# Patient Record
Sex: Male | Born: 1970 | Race: White | Hispanic: No | Marital: Single | Smoking: Never smoker
Health system: Southern US, Community
[De-identification: ages and names within clinical notes are randomized; demographics above are authoritative.]

## PROBLEM LIST (undated history)

## (undated) DIAGNOSIS — E1065 Type 1 diabetes mellitus with hyperglycemia: Secondary | ICD-10-CM

## (undated) DIAGNOSIS — F79 Unspecified intellectual disabilities: Secondary | ICD-10-CM

## (undated) DIAGNOSIS — Q079 Congenital malformation of nervous system, unspecified: Secondary | ICD-10-CM

## (undated) DIAGNOSIS — E785 Hyperlipidemia, unspecified: Secondary | ICD-10-CM

## (undated) DIAGNOSIS — IMO0002 Reserved for concepts with insufficient information to code with codable children: Secondary | ICD-10-CM

## (undated) DIAGNOSIS — S069XAA Unspecified intracranial injury with loss of consciousness status unknown, initial encounter: Secondary | ICD-10-CM

## (undated) DIAGNOSIS — S069X9A Unspecified intracranial injury with loss of consciousness of unspecified duration, initial encounter: Secondary | ICD-10-CM

## (undated) DIAGNOSIS — I10 Essential (primary) hypertension: Secondary | ICD-10-CM

## (undated) HISTORY — DX: Type 1 diabetes mellitus with hyperglycemia: E10.65

## (undated) HISTORY — DX: Reserved for concepts with insufficient information to code with codable children: IMO0002

## (undated) HISTORY — DX: Congenital malformation of nervous system, unspecified: Q07.9

---

## 2013-02-04 ENCOUNTER — Encounter (HOSPITAL_COMMUNITY): Payer: Self-pay | Admitting: Emergency Medicine

## 2013-02-04 ENCOUNTER — Emergency Department (HOSPITAL_COMMUNITY)
Admission: EM | Admit: 2013-02-04 | Discharge: 2013-02-05 | Disposition: A | Discharge status: Home / Self Care | Payer: Medicare Other | Attending: Emergency Medicine | Admitting: Emergency Medicine

## 2013-02-04 DIAGNOSIS — F79 Unspecified intellectual disabilities: Secondary | ICD-10-CM | POA: Insufficient documentation

## 2013-02-04 DIAGNOSIS — E162 Hypoglycemia, unspecified: Secondary | ICD-10-CM

## 2013-02-04 DIAGNOSIS — Z79899 Other long term (current) drug therapy: Secondary | ICD-10-CM | POA: Insufficient documentation

## 2013-02-04 DIAGNOSIS — Z87820 Personal history of traumatic brain injury: Secondary | ICD-10-CM | POA: Insufficient documentation

## 2013-02-04 DIAGNOSIS — E1069 Type 1 diabetes mellitus with other specified complication: Secondary | ICD-10-CM | POA: Insufficient documentation

## 2013-02-04 DIAGNOSIS — Z794 Long term (current) use of insulin: Secondary | ICD-10-CM | POA: Insufficient documentation

## 2013-02-04 HISTORY — DX: Unspecified intellectual disabilities: F79

## 2013-02-04 HISTORY — DX: Unspecified intracranial injury with loss of consciousness status unknown, initial encounter: S06.9XAA

## 2013-02-04 HISTORY — DX: Unspecified intracranial injury with loss of consciousness of unspecified duration, initial encounter: S06.9X9A

## 2013-02-04 LAB — GLUCOSE, CAPILLARY: GLUCOSE-CAPILLARY: 105 mg/dL — AB (ref 70–99)

## 2013-02-04 MED ORDER — SODIUM CHLORIDE 0.9 % IV BOLUS (SEPSIS)
1000.0000 mL | Freq: Once | INTRAVENOUS | Status: AC
  Administered 2013-02-05: 1000 mL via INTRAVENOUS

## 2013-02-04 NOTE — ED Notes (Signed)
2 attempts for IV stick unsuccessful. CN to attempt draw.

## 2013-02-04 NOTE — ED Notes (Signed)
CBG registered 86 on ED Glucometer

## 2013-02-04 NOTE — ED Provider Notes (Signed)
CSN: [id number]     Arrival date & time 02/04/13  2051 History   First MD Initiated Contact with Patient 02/04/13 2216     Chief Complaint  Patient presents with  . Hypoglycemia   (Consider location/radiation/quality/duration/timing/severity/associated sxs/prior Treatment) HPI  43 year old male with history of mental retardation, hx of traumatic brain injury presents for revaluation of hypoglycemia. History obtained through caregiver who is bedside. Patient with history of type 1 diabetes was found to be hypoglycemic today on regular blood sugar checked at 7:45 PM. His initial CBG was 56.  Patient has no specific symptom at that time. Caregiver indicate that patient is new to facility and has been in that facility for the past 5 days. Patient was recently removed because his mother who is the caregiver is recently diagnosed with cancer and unable to care for and appropriately. Per care giver; patient's blood sugar usually ranging between 86-225 however today CBG is 56.  It was a regular CBG check 3 hrs after he has dinner.  Pt received no treatment, has no specific complaint, was sent here for further care.  No reported recent medication change.  No recent sickness.  When asked if pt has any specific sxs or having any pain, pt denies.  Otherwise difficult to get hx from pt due to baseline mental retardation.     Past Medical History  Diagnosis Date  . 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 Mental retardation    History reviewed. No pertinent past surgical history. History reviewed. No pertinent family history. History  Substance Use Topics  . Smoking status: Never Smoker   . Smokeless tobacco: Never Used  . Alcohol Use: No    Review of Systems  Unable to perform ROS Hx of mental retardation  Allergies  Percocet and Tape  Home Medications   Current Outpatient Rx  Name  Route  Sig  Dispense  Refill  . amLODipine (NORVASC) 10 MG tablet   Oral   Take 10 mg  by mouth daily.         . benztropine (COGENTIN) 1 MG tablet   Oral   Take 1 mg by mouth 2 (two) times daily.         . cloNIDine (CATAPRES) 0.1 MG tablet   Oral   Take 0.1 mg by mouth 2 (two) times daily.         . DULoxetine (CYMBALTA) 30 MG capsule   Oral   Take 30 mg by mouth 2 (two) times daily.         . fexofenadine (ALLEGRA) 180 MG tablet   Oral   Take 180 mg by mouth daily.         . furosemide (LASIX) 40 MG tablet   Oral   Take 40 mg by mouth 2 (two) times daily.         . insulin aspart (NOVOLOG) 100 UNIT/ML injection   Subcutaneous   Inject 5-14 Units into the skin 3 (three) times daily before meals. Sliding scale         . insulin glargine (LANTUS) 100 UNIT/ML injection   Subcutaneous   Inject 60 Units into the skin at bedtime.         . lamoTRIgine (LAMICTAL) 100 MG tablet   Oral   Take 100 mg by mouth 2 (two) times daily.         Marland Kitchen. lisinopril-hydrochlorothiazide (PRINZIDE,ZESTORETIC) 20-12.5 MG per tablet   Oral   Take 1 tablet by mouth daily.         .Marland Kitchen  LORazepam (ATIVAN) 2 MG tablet   Oral   Take 2 mg by mouth every 4 (four) hours as needed for anxiety.         Marland Kitchen omeprazole (PRILOSEC) 40 MG capsule   Oral   Take 40 mg by mouth daily.         . traMADol (ULTRAM) 50 MG tablet   Oral   Take 50 mg by mouth every 6 (six) hours as needed.         . ziprasidone (GEODON) 80 MG capsule   Oral   Take 80 mg by mouth 2 (two) times daily with a meal.          BP 148/90  Pulse 108  Temp(Src) 98.7 F (37.1 C) (Oral)  Resp 18  SpO2 100% Physical Exam  Constitutional: He appears well-developed and well-nourished. No distress.  Baseline mental retardation, nontoxic in appearance.  HENT:  Head: Atraumatic.  Mouth/Throat: Oropharynx is clear and moist.  Eyes: Conjunctivae are normal.  Neck: Normal range of motion. Neck supple.  Cardiovascular: Normal rate.   Pulmonary/Chest: Effort normal and breath sounds normal.   Abdominal: Soft. There is no tenderness.  Musculoskeletal: He exhibits no edema.  Neurological: He is alert.  Skin: No rash noted.  Psychiatric: He has a normal mood and affect.    ED Course  Procedures (including critical care time)  11:11 PM Patient with one reading of hypoglycemia with a CBG of 56 at the nursing facility. CBG is 86 prior to arrival without any specific treatment. Patient has no specific complaint at this time. Will obtain basic labs but I suspect patient will be stable for discharge.  12:40 AM Electrolytes reassuring. Patient has been not elevated white count of 12.8 without left shift I do not think pt has infection.  Stable for discharge at this time.    Labs Review Labs Reviewed  BASIC METABOLIC PANEL - Abnormal; Notable for the following:    Sodium 135 (*)    Potassium 3.4 (*)    Chloride 94 (*)    All other components within normal limits  CBC WITH DIFFERENTIAL - Abnormal; Notable for the following:    WBC 12.8 (*)    Platelets 436 (*)    Neutro Abs 9.2 (*)    Monocytes Absolute 1.2 (*)    All other components within normal limits  GLUCOSE, CAPILLARY - Abnormal; Notable for the following:    Glucose-Capillary 105 (*)    All other components within normal limits   Imaging Review No results found.  EKG Interpretation   None       MDM   1. Hypoglycemia    BP 131/71  Pulse 85  Temp(Src) 97.5 F (36.4 C) (Oral)  Resp 20  SpO2 97%  I have reviewed nursing notes and vital signs.  I reviewed available ER/hospitalization records thought the EMR     Fayrene Helper, [place] 02/05/13 0110

## 2013-02-04 NOTE — ED Notes (Signed)
[facility] c/o hypoglycemia of 50's. Pt has history of behavioral disorder. Pt also c/o of right sided abdominal pain.

## 2013-02-05 LAB — BASIC METABOLIC PANEL
BUN: 22 mg/dL (ref 6–23)
CALCIUM: 9.7 mg/dL (ref 8.4–10.5)
CO2: 24 mEq/L (ref 19–32)
Chloride: 94 mEq/L — ABNORMAL LOW (ref 96–112)
Creatinine, Ser: 0.95 mg/dL (ref 0.50–1.35)
GFR calc Af Amer: >90 mL/min (ref >90)
GFR calc non Af Amer: >90 mL/min (ref >90)
GLUCOSE: 98 mg/dL (ref 70–99)
Potassium: 3.4 mEq/L — ABNORMAL LOW (ref 3.7–5.3)
SODIUM: 135 meq/L — AB (ref 137–147)

## 2013-02-05 LAB — CBC WITH DIFFERENTIAL/PLATELET
BASOS ABS: 0 10*3/uL (ref 0.0–0.1)
BASOS PCT: 0 % (ref 0–1)
EOS ABS: 0.1 10*3/uL (ref 0.0–0.7)
Eosinophils Relative: 1 % (ref 0–5)
HCT: 40.5 % (ref 39.0–52.0)
Hemoglobin: 14.2 g/dL (ref 13.0–17.0)
Lymphocytes Relative: 18 % (ref 12–46)
Lymphs Abs: 2.3 10*3/uL (ref 0.7–4.0)
MCH: 30.9 pg (ref 26.0–34.0)
MCHC: 35.1 g/dL (ref 30.0–36.0)
MCV: 88 fL (ref 78.0–100.0)
Monocytes Absolute: 1.2 10*3/uL — ABNORMAL HIGH (ref 0.1–1.0)
Monocytes Relative: 9 % (ref 3–12)
NEUTROS PCT: 72 % (ref 43–77)
Neutro Abs: 9.2 10*3/uL — ABNORMAL HIGH (ref 1.7–7.7)
PLATELETS: 436 10*3/uL — AB (ref 150–400)
RBC: 4.6 MIL/uL (ref 4.22–5.81)
RDW: 12.6 % (ref 11.5–15.5)
WBC: 12.8 10*3/uL — ABNORMAL HIGH (ref 4.0–10.5)

## 2013-02-05 NOTE — Discharge Instructions (Signed)
Low Blood Sugar °Low blood sugar (hypoglycemia) means that the level of sugar in your blood is lower than it should be. Signs of low blood sugar include: °· Getting sweaty. °· Feeling hungry. °· Feeling dizzy or weak. °· Feeling sleepier than normal. °· Feeling nervous. °· Headaches. °· Having a fast heartbeat. °Low blood sugar can happen fast and can be an emergency. Your doctor can do tests to check your blood sugar level. You can have low blood sugar and not have diabetes. °HOME CARE °· Check your blood sugar as told by your doctor. If it is less than 70 mg/dl or as told by your doctor, take 1 of the following: °· 3 to 4 glucose tablets. °· ½ cup clear juice. °· ½ cup soda pop, not diet. °· 1 cup milk. °· 5 to 6 hard candies. °· Recheck blood sugar after 15 minutes. Repeat until it is at the right level. °· Eat a snack if it is more than 1 hour until the next meal. °· Only take medicine as told by your doctor. °· Do not skip meals. Eat on time. °· Do not drink alcohol except with meals. °· Check your blood glucose before driving. °· Check your blood glucose before and after exercise. °· Always carry treatment with you, such as glucose pills. °· Always wear a medical alert bracelet if you have diabetes. °GET HELP RIGHT AWAY IF:  °· Your blood glucose goes below 70 mg/dl or as told by your doctor, and you: °· Are confused. °· Are not able to swallow. °· Pass out (faint). °· You cannot treat yourself. You may need someone to help you. °· You have low blood sugar problems often. °· You have problems from your medicines. °· You are not feeling better after 3 to 4 days. °· You have vision changes. °MAKE SURE YOU:  °· Understand these instructions. °· Will watch this condition. °· Will get help right away if you are not doing well or get worse. °Document Released: 04/12/2009 Document Revised: 04/10/2011 Document Reviewed: 04/12/2009 °[employer]® Patient Information ©2014 [employer]. ° °

## 2013-02-05 NOTE — ED Provider Notes (Signed)
Medical screening examination/treatment/procedure(s) were performed by non-physician practitioner and as supervising physician I was immediately available for consultation/collaboration.  EKG Interpretation   None         Dagmar HaitWilliam Geronimo Diliberto, MD 02/05/13 0111

## 2013-03-04 ENCOUNTER — Emergency Department (HOSPITAL_COMMUNITY): Payer: Medicare Other

## 2013-03-04 ENCOUNTER — Emergency Department (HOSPITAL_COMMUNITY)
Admission: EM | Admit: 2013-03-04 | Discharge: 2013-03-04 | Disposition: A | Discharge status: Home / Self Care | Payer: Medicare Other | Attending: Emergency Medicine | Admitting: Emergency Medicine

## 2013-03-04 ENCOUNTER — Encounter (HOSPITAL_COMMUNITY): Payer: Self-pay | Admitting: Emergency Medicine

## 2013-03-04 DIAGNOSIS — IMO0002 Reserved for concepts with insufficient information to code with codable children: Secondary | ICD-10-CM | POA: Insufficient documentation

## 2013-03-04 DIAGNOSIS — S0990XA Unspecified injury of head, initial encounter: Secondary | ICD-10-CM

## 2013-03-04 DIAGNOSIS — Z8659 Personal history of other mental and behavioral disorders: Secondary | ICD-10-CM | POA: Insufficient documentation

## 2013-03-04 DIAGNOSIS — Z79899 Other long term (current) drug therapy: Secondary | ICD-10-CM | POA: Insufficient documentation

## 2013-03-04 DIAGNOSIS — S62308A Unspecified fracture of other metacarpal bone, initial encounter for closed fracture: Secondary | ICD-10-CM

## 2013-03-04 DIAGNOSIS — Z794 Long term (current) use of insulin: Secondary | ICD-10-CM | POA: Insufficient documentation

## 2013-03-04 DIAGNOSIS — Y9289 Other specified places as the place of occurrence of the external cause: Secondary | ICD-10-CM | POA: Insufficient documentation

## 2013-03-04 DIAGNOSIS — W2209XA Striking against other stationary object, initial encounter: Secondary | ICD-10-CM | POA: Insufficient documentation

## 2013-03-04 DIAGNOSIS — Z87820 Personal history of traumatic brain injury: Secondary | ICD-10-CM | POA: Insufficient documentation

## 2013-03-04 DIAGNOSIS — S62309A Unspecified fracture of unspecified metacarpal bone, initial encounter for closed fracture: Secondary | ICD-10-CM | POA: Insufficient documentation

## 2013-03-04 DIAGNOSIS — Y9389 Activity, other specified: Secondary | ICD-10-CM | POA: Insufficient documentation

## 2013-03-04 DIAGNOSIS — S0180XA Unspecified open wound of other part of head, initial encounter: Secondary | ICD-10-CM | POA: Insufficient documentation

## 2013-03-04 MED ORDER — TETANUS-DIPHTH-ACELL PERTUSSIS 5-2.5-18.5 LF-MCG/0.5 IM SUSP: 0.5000 mL | Freq: Once | INTRAMUSCULAR | Status: DC
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
## 2013-03-04 NOTE — Discharge Instructions (Signed)
Head injury ° °You have had a head injury which does not appear to require admission at this time. A concussion is a status changed mental ability because of trauma. ° °Seek immediate medical attention if: ° °· There is confusion or drowsiness °· You cannot awaken the injured portion °· (Although children frequently become drowsy after injury) °· There is nausea or continued, forceful vomiting °· You notice dizziness or unsteadiness which is getting worse, or inability to walk °· You have convulsions or unconsciousness °· You experience a severe, persistent headaches not relieved by Tylenol. (Do not take aspirin as this in pairs clotting abilities). Take other pain medications only as directed °· You cannot use arms or legs normally °· There are changes in pupil size of the eye °· There is clear or bloody discharge from the nose or ears °· Change in speech, vision, swallowing or understanding. °· Localized weakness, numbness, tingling or change in bowel or bladder control ° ° °Please followup with your doctor in the next 2 days if still having symptoms. If you do not have a family doctor, see the list of followup contact information below. ° °RESOURCE GUIDE ° °Dental Problems ° °Patients with Medicaid: °[facility]                     [facility] °[address]                                           [address] °Phone:  [phone]                                                  Phone:  [phone] ° °If unable to pay or uninsured, contact:  [facility] or [facility] to become qualified for the adult dental clinic. ° °Chronic Pain Problems °Contact [facility]  297-2271 °Patients need to be referred by their primary care doctor. ° °Insufficient Money for Medicine °Contact [employer]:  call "211" or [facility] 271-5999. ° °No Primary Care Doctor °Call [facility]  832-8000 °Other agencies that provide inexpensive medical care ° [facility]  832-8035 °   [facility]  832-7272 °   [facility]  271-5999 °   [facility]  832-4777 °   [facility]  373-0678 °   [facility]  272-1050 ° °Psychological Services °[facility]  832-9600 °[facility]  378-7881 °[facility]   800 853-5163 (emergency services 641-4993) ° °Substance Abuse Resources °[facility]  336-882-2125 °[facility] 336-784-9470 °[facility] 336-285-9073 °[facility] 336-845-3988 °Residential [facility] Outpatient [facility]stance Abuse Program  800-659-3381 ° °Abuse/Neglect °[place] [facility] (336) 641-3795 °[place] [facility] 800-378-5315 (After Hours) ° °Emergency Shelter °[facility] (336) 271-5985 ° °Maternity Homes °[facility] (336) 275-9566 °[facility] (704) 372-4663 ° °MRSA Hotline #:   832-7006 ° ° ° °[place] [facility] ° °[facility] [place] [facility]     [employer]                            [facility] [address]                        [address]      [address] [place] [address]  Blondell Reveal Phone:  [phone]                                   Phone:  (531)[phone]                 Phone:  878-042-7923  [facility] Phone:  (614)035-9956  [facility] Child Abuse Hotline 684-741-0449 2760489639 (After Hours)     Metacarpal Fracture   The metacarpal bones are in the middle of the hand, connecting the fingers to the wrist. A metacarpal fracture is a break in one of these bones. It is common for an injury of the hand to break one or more of these bones. A metacarpal fracture of the fifth (little) finger, near the knuckle, is also known as a boxer's fracture. SYMPTOMS   Severe pain at the time of  injury.  Pain, tenderness, swelling (especially the back of the hand).  Bruising of the hand within 48 hours.  Visible deformity, if the fracture out of alignment (displaced).  Numbness or paralysis from swelling in the hand, causing pressure on the blood vessels or nerves (uncommon). CAUSES   Direct hit (trauma) to the hand, such as a striking blow with the fist.  Indirect stress to the hand, such as twisting or violent muscle contraction (uncommon). RISK INCREASES WITH:  Contact sports (football, rugby, soccer).  Sports that require hitting (boxing, martial arts).  History of bone or joint disease, including osteoporosis.  Poor hand strength and flexibility. PREVENTION  Maintain proper conditioning:  Hand and finger strength.  Flexibility and endurance.  For contact sports, wear properly fitted and padded protective equipment for the hand.  Learn and use proper technique when hitting, punching, and landing from a fall. PROGNOSIS If treated properly, metacarpal fractures can be expected to heal within 4 to 6 weeks. For severe injuries, surgery may be needed. RELATED COMPLICATIONS   Fracture does not heal (nonunion).  Heals in a poor position, including twisted fingers (malunion).  Chronic pain, stiffness, or swelling of the hand.  Excessive bleeding in the hand, causing pressure and injury to nerves and blood vessels (rare).  Unstable or arthritic joint, following repeated injury or delayed treatment.  Hindrance of normal hand growth in children.  Infection in open fractures (skin broken over fracture) or at the incision or pin sites, if surgery was performed.  Shortening or injured bones.  Bony bump (spur) or loss of shape of the knuckles. TREATMENT  Treatment will vary, depending on the extent of the injury. First, ice and medicine will help reduce pain and inflammation. For a single metacarpal fracture that is not displaced and does not involve the joint,  restraint is usually sufficient for healing to  occur. Multiple metacarpal fractures, fractures that are displaced, or fractures involving the joint may require surgery. Surgery often involves placing pins and screws in the bones, to hold them in place. Restraint of the injury follows surgery, to allow for healing. After restraint (with or without surgery), stretching and strengthening exercises may be needed to regain strength and a full range of motion. Exercises may be done at home or with a therapist. Sometimes, depending on the sport and position, a brace or splint may be needed when first returning to sports. MEDICATION   Do not take pain medicine for 7 days before surgery.  Only take over-the-counter or prescription medicines for pain, fever, or discomfort as directed by your caregiver.  Prescription pain medicines are usually prescribed only after surgery. Use only as directed and only as much as you need. COLD THERAPY  Cold treatment (icing) should be applied for 10 to 15 minutes every 2 to 3 hours for inflammation and pain, and immediately after activity that aggravates your symptoms. Use ice packs or an ice massage. SEEK IMMEDIATE MEDICAL CARE IF:   Pain, tenderness, or swelling gets worse even with treatment.  You have pain, numbness, or coldness in the hand.  Blue, gray, or dark color appears in the fingernails.  Any of the following occur after surgery:  You have an oral temperature above 102 F (38.9 C), not controlled by medicine.  You have increased pain, swelling, redness, drainage of fluids, or bleeding in the affected area.  New, unexplained symptoms develop. (Drugs used in treatment may produce side effects.) Document Released: 01/30/1998 Document Revised: 04/10/2011 Document Reviewed: 04/30/2008 [facility] Patient Information 2014 [place], [place].

## 2013-03-05 ENCOUNTER — Encounter: Payer: Self-pay | Admitting: Neurology

## 2013-03-05 LAB — GLUCOSE, CAPILLARY: Glucose-Capillary: 244 mg/dL — ABNORMAL HIGH (ref 70–99)

## 2013-03-06 ENCOUNTER — Encounter: Payer: Self-pay | Admitting: Neurology

## 2013-03-06 ENCOUNTER — Ambulatory Visit (INDEPENDENT_AMBULATORY_CARE_PROVIDER_SITE_OTHER): Payer: Medicare Other | Admitting: Neurology

## 2013-03-06 VITALS — BP 142/86 | HR 97 | Ht 68.75 in | Wt 185.0 lb

## 2013-03-06 DIAGNOSIS — R569 Unspecified convulsions: Secondary | ICD-10-CM

## 2013-03-06 DIAGNOSIS — S069X9A Unspecified intracranial injury with loss of consciousness of unspecified duration, initial encounter: Secondary | ICD-10-CM

## 2013-03-06 DIAGNOSIS — S069XAA Unspecified intracranial injury with loss of consciousness status unknown, initial encounter: Secondary | ICD-10-CM | POA: Insufficient documentation

## 2013-03-06 DIAGNOSIS — F79 Unspecified intellectual disabilities: Secondary | ICD-10-CM

## 2013-03-06 NOTE — Patient Instructions (Signed)
Instructions given on group home paperwork
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
## 2013-03-08 NOTE — ED Provider Notes (Signed)
Medical screening examination/treatment/procedure(s) were performed by non-physician practitioner and as supervising physician I was immediately available for consultation/collaboration.  Meygan Kyser T Micharl Helmes, MD 03/08/13 1643 

## 2013-03-27 ENCOUNTER — Emergency Department (HOSPITAL_COMMUNITY)
Admission: EM | Admit: 2013-03-27 | Discharge: 2013-03-27 | Disposition: A | Discharge status: Home / Self Care | Payer: Medicare Other | Attending: Emergency Medicine | Admitting: Emergency Medicine

## 2013-03-27 ENCOUNTER — Encounter (HOSPITAL_COMMUNITY): Payer: Self-pay | Admitting: Emergency Medicine

## 2013-03-27 DIAGNOSIS — R739 Hyperglycemia, unspecified: Secondary | ICD-10-CM

## 2013-03-27 DIAGNOSIS — Z8659 Personal history of other mental and behavioral disorders: Secondary | ICD-10-CM | POA: Insufficient documentation

## 2013-03-27 DIAGNOSIS — E1065 Type 1 diabetes mellitus with hyperglycemia: Secondary | ICD-10-CM | POA: Insufficient documentation

## 2013-03-27 DIAGNOSIS — Z794 Long term (current) use of insulin: Secondary | ICD-10-CM | POA: Insufficient documentation

## 2013-03-27 DIAGNOSIS — Q079 Congenital malformation of nervous system, unspecified: Secondary | ICD-10-CM | POA: Insufficient documentation

## 2013-03-27 DIAGNOSIS — IMO0002 Reserved for concepts with insufficient information to code with codable children: Secondary | ICD-10-CM | POA: Insufficient documentation

## 2013-03-27 DIAGNOSIS — I951 Orthostatic hypotension: Secondary | ICD-10-CM | POA: Insufficient documentation

## 2013-03-27 DIAGNOSIS — Z87820 Personal history of traumatic brain injury: Secondary | ICD-10-CM | POA: Insufficient documentation

## 2013-03-27 DIAGNOSIS — Z79899 Other long term (current) drug therapy: Secondary | ICD-10-CM | POA: Insufficient documentation

## 2013-03-27 HISTORY — DX: Essential (primary) hypertension: I10

## 2013-03-27 HISTORY — DX: Hyperlipidemia, unspecified: E78.5

## 2013-03-27 LAB — CBC WITH DIFFERENTIAL/PLATELET
Basophils Absolute: 0 10*3/uL (ref 0.0–0.1)
Basophils Relative: 0 % (ref 0–1)
EOS ABS: 0.1 10*3/uL (ref 0.0–0.7)
EOS PCT: 1 % (ref 0–5)
HCT: 37.1 % — ABNORMAL LOW (ref 39.0–52.0)
HEMOGLOBIN: 12.7 g/dL — AB (ref 13.0–17.0)
Lymphocytes Relative: 19 % (ref 12–46)
Lymphs Abs: 1.7 10*3/uL (ref 0.7–4.0)
MCH: 30 pg (ref 26.0–34.0)
MCHC: 34.2 g/dL (ref 30.0–36.0)
MCV: 87.7 fL (ref 78.0–100.0)
MONOS PCT: 7 % (ref 3–12)
Monocytes Absolute: 0.7 10*3/uL (ref 0.1–1.0)
NEUTROS PCT: 72 % (ref 43–77)
Neutro Abs: 6.4 10*3/uL (ref 1.7–7.7)
Platelets: 349 10*3/uL (ref 150–400)
RBC: 4.23 MIL/uL (ref 4.22–5.81)
RDW: 13.2 % (ref 11.5–15.5)
WBC: 8.9 10*3/uL (ref 4.0–10.5)

## 2013-03-27 LAB — BASIC METABOLIC PANEL
BUN: 14 mg/dL (ref 6–23)
CALCIUM: 9.3 mg/dL (ref 8.4–10.5)
CO2: 27 mEq/L (ref 19–32)
Chloride: 95 mEq/L — ABNORMAL LOW (ref 96–112)
Creatinine, Ser: 0.86 mg/dL (ref 0.50–1.35)
Glucose, Bld: 330 mg/dL — ABNORMAL HIGH (ref 70–99)
Potassium: 3.6 mEq/L — ABNORMAL LOW (ref 3.7–5.3)
Sodium: 136 mEq/L — ABNORMAL LOW (ref 137–147)

## 2013-03-27 LAB — URINALYSIS, ROUTINE W REFLEX MICROSCOPIC
Bilirubin Urine: NEGATIVE
Hgb urine dipstick: NEGATIVE
Ketones, ur: NEGATIVE mg/dL
LEUKOCYTES UA: NEGATIVE
NITRITE: NEGATIVE
Protein, ur: NEGATIVE mg/dL
SPECIFIC GRAVITY, URINE: 1.01 (ref 1.005–1.030)
Urobilinogen, UA: 0.2 mg/dL (ref 0.0–1.0)
pH: 7 (ref 5.0–8.0)

## 2013-03-27 LAB — URINE MICROSCOPIC-ADD ON

## 2013-03-27 LAB — CBG MONITORING, ED: Glucose-Capillary: 300 mg/dL — ABNORMAL HIGH (ref 70–99)

## 2013-03-27 MED ORDER — SODIUM CHLORIDE 0.9 % IV BOLUS (SEPSIS)
1000.0000 mL | Freq: Once | INTRAVENOUS | Status: AC
  Administered 2013-03-27: 1000 mL via INTRAVENOUS

## 2013-03-27 NOTE — ED Notes (Signed)
Bed: WA24 Expected date:  Expected time:  Means of arrival:  Comments: EMS-hyperglycemia 

## 2013-03-27 NOTE — Discharge Instructions (Signed)
Today you were evaluated for low blood pressure. Your blood pressure dropped when you stood up. This is called orthostatic hypotension. This may be due to the clonidine that you were recently started on. Please follow up with the physician that prescribed this medication for a possible dose adjustment.   High Blood Sugar High blood sugar (hyperglycemia) means that the level of sugar in your blood is higher than it should be. Signs of high blood sugar include:  Feeling thirsty.  Frequent peeing (urinating).  Feeling tired or sleepy.  Dry mouth.  Vision changes.  Feeling weak.  Feeling hungry but losing weight.  Numbness and tingling in your hands or feet.  Headache. When you ignore these signs, your blood sugar may keep going up. These problems may get worse, and other problems may begin. HOME CARE  Check your blood sugars as told by your doctor. Write down the numbers with the date and time.  Take the right amount of insulin or diabetes pills at the right time. Write down the dose with date and time.  Refill your insulin or diabetes pills before running out.  Watch what you eat. Follow your meal plan.  Drink liquids without sugar, such as water. Check with your doctor if you have kidney or heart disease.  Follow your doctor's orders for exercise. Exercise at the same time of day.  Keep your doctor's appointments. GET HELP RIGHT AWAY IF:   You have trouble thinking or are confused.  You have fast breathing with fruity smelling breath.  You pass out (faint).  You have 2 to 3 days of high blood sugars and you do not know why.  You have chest pain.  You are feeling sick to your stomach (nauseous) or throwing up (vomiting).  You have sudden vision changes. MAKE SURE YOU:   Understand these instructions.  Will watch your condition.  Will get help right away if you are not doing well or get worse. Document Released: 11/13/2008 Document Revised: 04/10/2011 Document  Reviewed: 11/13/2008 [facility] Patient Information 2014 [place], [place].   Orthostatic Hypotension Orthostatic hypotension is a sudden fall in blood pressure. It occurs when a person goes from a sitting or lying position to a standing position. CAUSES   Loss of body fluids (dehydration).  Medicines that lower blood pressure.  Sudden changes in posture, such as sudden standing when you have been sitting or lying down.  Taking too much of your medicine. SYMPTOMS   Lightheadedness or dizziness.  Fainting or near-fainting.  A fast heart rate (tachycardia).  Weakness.  Feeling tired (fatigue). DIAGNOSIS  Your caregiver may find the cause of orthostatic hypotension through:  A history and/or physical exam.  Checking your blood pressure. Your caregiver will check your blood pressure when you are:  Lying down.  Sitting.  Standing.  Tilt table testing. In this test, you are placed on a table that goes from a lying position to a standing position. You will be strapped to the table. This test helps to monitor your blood pressure and heart rate when you are in different positions. TREATMENT   If orthostatic hypotension is caused by your medicines, your caregiver will need to adjust your dosage. Do not stop or adjust your medicine on your own.  When changing positions, make these changes slowly. This allows your body to adjust to the different position.  Compression stockings that are worn on your lower legs may be helpful.  Your caregiver may have you consume extra salt. Do not add  extra salt to your diet unless directed by your caregiver.  Eat frequent, small meals. Avoid sudden standing after eating.  Avoid hot showers or excessive heat.  Your caregiver may give you fluids through the vein (intravenous).  Your caregiver may put you on medicine to help enhance fluid retention. SEEK IMMEDIATE MEDICAL CARE IF:   You faint or have a near-fainting episode. Call your local  emergency services (911 in [place]  You have or develop chest pain.  You feel sick to your stomach (nauseous) or vomit.  You have a loss of feeling or movement in your arms or legs.  You have difficulty talking, slurred speech, or you are unable to talk.  You have difficulty thinking or have confused thinking. MAKE SURE YOU:   Understand these instructions.  Will watch your condition.  Will get help right away if you are not doing well or get worse. Document Released: 01/06/2002 Document Revised: 04/10/2011 Document Reviewed: 05/01/2008 [facility] Patient Information 2014 [place], [place].

## 2013-03-27 NOTE — ED Provider Notes (Signed)
CSN: [id number]     Arrival date & time 03/27/13  1113 History   First MD Initiated Contact with Patient 03/27/13 1125     Chief Complaint  Patient presents with  . Hypotension     (Consider location/radiation/quality/duration/timing/severity/associated sxs/prior Treatment) HPI Comments: Patient is a 43 year old male with history of TBI, mental retardation, diabetes who presents today from his group home. Per the EMS report he had a medication change yesterday and was hypotensive today. He was recently started on clonidine and ativan for behavior issues. In the ED he is orthostatic and hyperglycemic. Hx limited due to patient's mental retardation. He does not have any physical complaints that he reveals to me. He is actively eating graham crackers on my exam. Patient appears comfortable.   The history is provided by the patient. No language interpreter was used.    Past Medical History  Diagnosis Date  . 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 Mental retardation   . Type I (juvenile type) diabetes mellitus without mention of complication, uncontrolled   . Unspecified congenital anomaly of brain, spinal cord, and nervous system    No past surgical history on file. No family history on file. History  Substance Use Topics  . Smoking status: Never Smoker   . Smokeless tobacco: Never Used  . Alcohol Use: No    Review of Systems  Unable to perform ROS: Other      Allergies  Percocet and Tape  Home Medications   Current Outpatient Rx  Name  Route  Sig  Dispense  Refill  . amLODipine (NORVASC) 10 MG tablet   Oral   Take 10 mg by mouth daily.         . benztropine (COGENTIN) 2 MG tablet   Oral   Take 2 mg by mouth 2 (two) times daily.         . cloNIDine (CATAPRES) 0.1 MG tablet   Oral   Take 0.1 mg by mouth 2 (two) times daily.         . DULoxetine (CYMBALTA) 30 MG capsule   Oral   Take 30 mg by mouth 2 (two) times daily.         .  fexofenadine (ALLEGRA) 180 MG tablet   Oral   Take 180 mg by mouth daily.         . fluticasone (CUTIVATE) 0.05 % cream   Topical   Apply 1 application topically 2 (two) times daily.         . furosemide (LASIX) 40 MG tablet   Oral   Take 40 mg by mouth as needed for fluid.          Marland Kitchen gabapentin (NEURONTIN) 600 MG tablet   Oral   Take 1,200 mg by mouth at bedtime.         . insulin aspart (NOVOLOG) 100 UNIT/ML injection   Subcutaneous   Inject 5-14 Units into the skin 3 (three) times daily before meals. Sliding scale         . insulin glargine (LANTUS) 100 UNIT/ML injection   Subcutaneous   Inject 60 Units into the skin at bedtime.         . lamoTRIgine (LAMICTAL) 100 MG tablet   Oral   Take 100 mg by mouth 2 (two) times daily.         Marland Kitchen lisinopril-hydrochlorothiazide (PRINZIDE,ZESTORETIC) 20-12.5 MG per tablet   Oral   Take 1 tablet by mouth daily.         Marland Kitchen  LORazepam (ATIVAN) 1 MG tablet   Oral   Take 1 mg by mouth 4 (four) times daily as needed for anxiety.         Marland Kitchen omeprazole (PRILOSEC) 40 MG capsule   Oral   Take 40 mg by mouth daily.         . Oxcarbazepine (TRILEPTAL) 300 MG tablet   Oral   Take 300 mg by mouth daily.         . simvastatin (ZOCOR) 20 MG tablet   Oral   Take 20 mg by mouth daily at 6 PM.         . traMADol (ULTRAM) 50 MG tablet   Oral   Take 50 mg by mouth every 6 (six) hours as needed.         . ziprasidone (GEODON) 80 MG capsule   Oral   Take 80 mg by mouth 2 (two) times daily with a meal.          BP 107/46  Pulse 78  Temp(Src) 97.6 F (36.4 C) (Oral)  Resp 20  SpO2 100% Physical Exam  Nursing note and vitals reviewed. Constitutional: He is oriented to person, place, and time. He appears well-developed and well-nourished. He does not appear ill. No distress.  HENT:  Head: Atraumatic.  Right Ear: External ear normal.  Left Ear: External ear normal.  Nose: Nose normal.  Eyes: Conjunctivae are  normal.  Neck: Normal range of motion. No tracheal deviation present.  Cardiovascular: Normal rate, regular rhythm, normal heart sounds, intact distal pulses and normal pulses.   Pulmonary/Chest: Effort normal and breath sounds normal. No stridor.  Abdominal: Soft. He exhibits no distension. There is no tenderness.  Musculoskeletal: Normal range of motion.  Neurological: He is alert and oriented to person, place, and time. He has normal strength. Gait normal.  Patient easily ambulates in the ED. Gait is normal.   Skin: Skin is warm and dry. He is not diaphoretic.  Psychiatric: He has a normal mood and affect. His behavior is normal.    ED Course  Procedures (including critical care time) Labs Review Labs Reviewed  CBC WITH DIFFERENTIAL - Abnormal; Notable for the following:    Hemoglobin 12.7 (*)    HCT 37.1 (*)    All other components within normal limits  BASIC METABOLIC PANEL - Abnormal; Notable for the following:    Sodium 136 (*)    Potassium 3.6 (*)    Chloride 95 (*)    Glucose, Bld 330 (*)    All other components within normal limits  URINALYSIS, ROUTINE W REFLEX MICROSCOPIC - Abnormal; Notable for the following:    Glucose, UA >1000 (*)    All other components within normal limits  CBG MONITORING, ED - Abnormal; Notable for the following:    Glucose-Capillary 300 (*)    All other components within normal limits  URINE CULTURE  URINE MICROSCOPIC-ADD ON   Imaging Review No results found.  EKG Interpretation   None       MDM   Final diagnoses:  Orthostatic hypotension  Hyperglycemia    Patient presents to ED for evaluation of hypotension. He has orthostatic hypotension here. Blood pressure dropped from 129/84 to 107/46 when going from laying to standing. Labs are unremarkable. Anion gap of 14, bicarb is normal. No ketones in urine. Patient given a liter of fluid and BP increased to 141/87. Patient appears well with no complaints. Discussed with the worker from  the group home to  have the patient follow up with his primary doctor. He may need clonidine dose adjustment as this could be causing his hypotension. Return instructions given. Vital signs stable for discharge. Discussed case with Dr. Criss AlvineGoldston who agrees with plan. Patient / Family / Caregiver informed of clinical course, understand medical decision-making process, and agree with plan.   Filed Vitals:   03/27/13 1200 03/27/13 1230 03/27/13 1300 03/27/13 1330  BP: 115/68 122/76 123/89 141/87  Pulse: 66  63 77  Temp:      TempSrc:      Resp:      SpO2: 100%  100% 100%      Mora BellmanHannah S Clydell Sposito, PA-C 03/27/13 1532

## 2013-03-27 NOTE — ED Notes (Signed)
Per EMS pt from group home. New medication changes yesterday: ativan and clonidine increased in dose. Group home sent pt for evaluation for orthostatic changes in blood pressure.

## 2013-03-28 LAB — URINE CULTURE
Colony Count: NO GROWTH
Culture: NO GROWTH

## 2013-03-29 NOTE — ED Provider Notes (Signed)
Medical screening examination/treatment/procedure(s) were performed by non-physician practitioner and as supervising physician I was immediately available for consultation/collaboration.   EKG Interpretation None        Audree CamelScott T Lauren Aguayo, MD 03/29/13 [phone]

## 2013-04-04 ENCOUNTER — Ambulatory Visit: Payer: Medicare Other | Admitting: *Deleted

## 2013-04-04 ENCOUNTER — Ambulatory Visit: Payer: Medicare Other | Attending: Family Medicine | Admitting: Rehabilitative and Restorative Service Providers"

## 2013-04-04 DIAGNOSIS — I69919 Unspecified symptoms and signs involving cognitive functions following unspecified cerebrovascular disease: Secondary | ICD-10-CM | POA: Insufficient documentation

## 2013-04-04 DIAGNOSIS — Z9181 History of falling: Secondary | ICD-10-CM | POA: Insufficient documentation

## 2013-04-04 DIAGNOSIS — Q079 Congenital malformation of nervous system, unspecified: Secondary | ICD-10-CM | POA: Insufficient documentation

## 2013-04-04 DIAGNOSIS — R4189 Other symptoms and signs involving cognitive functions and awareness: Secondary | ICD-10-CM | POA: Insufficient documentation

## 2013-04-04 DIAGNOSIS — Z5189 Encounter for other specified aftercare: Secondary | ICD-10-CM | POA: Insufficient documentation

## 2013-04-27 ENCOUNTER — Encounter (HOSPITAL_COMMUNITY): Payer: Self-pay | Admitting: Emergency Medicine

## 2013-04-27 ENCOUNTER — Emergency Department (HOSPITAL_COMMUNITY)
Admission: EM | Admit: 2013-04-27 | Discharge: 2013-04-27 | Disposition: A | Discharge status: Home / Self Care | Payer: Medicare Other | Attending: Emergency Medicine | Admitting: Emergency Medicine

## 2013-04-27 DIAGNOSIS — Z79899 Other long term (current) drug therapy: Secondary | ICD-10-CM | POA: Insufficient documentation

## 2013-04-27 DIAGNOSIS — E785 Hyperlipidemia, unspecified: Secondary | ICD-10-CM | POA: Insufficient documentation

## 2013-04-27 DIAGNOSIS — E162 Hypoglycemia, unspecified: Secondary | ICD-10-CM

## 2013-04-27 DIAGNOSIS — E1069 Type 1 diabetes mellitus with other specified complication: Principal | ICD-10-CM

## 2013-04-27 DIAGNOSIS — Z87728 Personal history of other specified (corrected) congenital malformations of nervous system and sense organs: Secondary | ICD-10-CM | POA: Insufficient documentation

## 2013-04-27 DIAGNOSIS — E1065 Type 1 diabetes mellitus with hyperglycemia: Secondary | ICD-10-CM | POA: Insufficient documentation

## 2013-04-27 DIAGNOSIS — IMO0002 Reserved for concepts with insufficient information to code with codable children: Secondary | ICD-10-CM | POA: Insufficient documentation

## 2013-04-27 DIAGNOSIS — I10 Essential (primary) hypertension: Secondary | ICD-10-CM | POA: Insufficient documentation

## 2013-04-27 DIAGNOSIS — R945 Abnormal results of liver function studies: Secondary | ICD-10-CM

## 2013-04-27 DIAGNOSIS — Z87820 Personal history of traumatic brain injury: Secondary | ICD-10-CM | POA: Insufficient documentation

## 2013-04-27 DIAGNOSIS — R7989 Other specified abnormal findings of blood chemistry: Secondary | ICD-10-CM | POA: Insufficient documentation

## 2013-04-27 DIAGNOSIS — F79 Unspecified intellectual disabilities: Secondary | ICD-10-CM | POA: Insufficient documentation

## 2013-04-27 DIAGNOSIS — Z794 Long term (current) use of insulin: Secondary | ICD-10-CM | POA: Insufficient documentation

## 2013-04-27 LAB — COMPREHENSIVE METABOLIC PANEL
ALT: 14 U/L (ref 0–53)
AST: 14 U/L (ref 0–37)
Albumin: 3.9 g/dL (ref 3.5–5.2)
Alkaline Phosphatase: 154 U/L — ABNORMAL HIGH (ref 39–117)
BUN: 11 mg/dL (ref 6–23)
CALCIUM: 9.5 mg/dL (ref 8.4–10.5)
CO2: 29 meq/L (ref 19–32)
Chloride: 97 mEq/L (ref 96–112)
Creatinine, Ser: 0.86 mg/dL (ref 0.50–1.35)
GFR calc non Af Amer: >90 mL/min (ref >90)
GLUCOSE: 165 mg/dL — AB (ref 70–99)
POTASSIUM: 3.9 meq/L (ref 3.7–5.3)
SODIUM: 138 meq/L (ref 137–147)
TOTAL PROTEIN: 6.8 g/dL (ref 6.0–8.3)
Total Bilirubin: 0.2 mg/dL — ABNORMAL LOW (ref 0.3–1.2)

## 2013-04-27 LAB — URINALYSIS, ROUTINE W REFLEX MICROSCOPIC
Bilirubin Urine: NEGATIVE
Glucose, UA: NEGATIVE mg/dL
HGB URINE DIPSTICK: NEGATIVE
Ketones, ur: NEGATIVE mg/dL
Leukocytes, UA: NEGATIVE
Nitrite: NEGATIVE
PH: 6 (ref 5.0–8.0)
Protein, ur: NEGATIVE mg/dL
SPECIFIC GRAVITY, URINE: 1.007 (ref 1.005–1.030)
Urobilinogen, UA: 0.2 mg/dL (ref 0.0–1.0)

## 2013-04-27 LAB — CBC WITH DIFFERENTIAL/PLATELET
Basophils Absolute: 0 10*3/uL (ref 0.0–0.1)
Basophils Relative: 0 % (ref 0–1)
EOS ABS: 0.1 10*3/uL (ref 0.0–0.7)
EOS PCT: 1 % (ref 0–5)
HCT: 38.4 % — ABNORMAL LOW (ref 39.0–52.0)
Hemoglobin: 13.2 g/dL (ref 13.0–17.0)
LYMPHS ABS: 2 10*3/uL (ref 0.7–4.0)
LYMPHS PCT: 22 % (ref 12–46)
MCH: 30.4 pg (ref 26.0–34.0)
MCHC: 34.4 g/dL (ref 30.0–36.0)
MCV: 88.5 fL (ref 78.0–100.0)
Monocytes Absolute: 0.5 10*3/uL (ref 0.1–1.0)
Monocytes Relative: 5 % (ref 3–12)
NEUTROS PCT: 72 % (ref 43–77)
Neutro Abs: 6.6 10*3/uL (ref 1.7–7.7)
Platelets: 362 10*3/uL (ref 150–400)
RBC: 4.34 MIL/uL (ref 4.22–5.81)
RDW: 13.6 % (ref 11.5–15.5)
WBC: 9.2 10*3/uL (ref 4.0–10.5)

## 2013-04-27 LAB — CBG MONITORING, ED: Glucose-Capillary: 108 mg/dL — ABNORMAL HIGH (ref 70–99)

## 2013-04-27 NOTE — Discharge Instructions (Signed)
PLEASE WITHHOLD STEPHENS NIGHT TIME DOSE OF LANTUS AND USE HIS NOVOLOG ON A SLIDING SCALE. CHECK SUGAR IN THE MORNING AND TREAT ACCORDINGLY. PLEASE CALL DR. BOYD IN THE MORNING TO DISCUSS INSULIN DOSAGE.   Hypoglycemia (Low Blood Sugar) Hypoglycemia is when the glucose (sugar) in your blood is too low. Hypoglycemia can happen for many reasons. It can happen to people with or without diabetes. Hypoglycemia can develop quickly and can be a medical emergency.  CAUSES  Having hypoglycemia does not mean that you will develop diabetes. Different causes include:  Missed or delayed meals or not enough carbohydrates eaten.  Medication overdose. This could be by accident or deliberate. If by accident, your medication may need to be adjusted or changed.  Exercise or increased activity without adjustments in carbohydrates or medications.  A nerve disorder that affects body functions like your heart rate, blood pressure and digestion (autonomic neuropathy).  A condition where the stomach muscles do not function properly (gastroparesis). Therefore, medications may not absorb properly.  The inability to recognize the signs of hypoglycemia (hypoglycemic unawareness).  Absorption of insulin  may be altered.  Alcohol consumption.  Pregnancy/menstrual cycles/postpartum. This may be due to hormones.  Certain kinds of tumors. This is very rare. SYMPTOMS   Sweating.  Hunger.  Dizziness.  Blurred vision.  Drowsiness.  Weakness.  Headache.  Rapid heart beat.  Shakiness.  Nervousness. DIAGNOSIS  Diagnosis is made by monitoring blood glucose in one or all of the following ways:  Fingerstick blood glucose monitoring.  Laboratory results. TREATMENT  If you think your blood glucose is low:  Check your blood glucose, if possible. If it is less than 70 mg/dl, take one of the following:  3-4 glucose tablets.   cup juice (prefer clear like apple).   cup "regular" soda pop.  1 cup  milk.  -1 tube of glucose gel.  5-6 hard candies.  Do not over treat because your blood glucose (sugar) will only go too high.  Wait 15 minutes and recheck your blood glucose. If it is still less than 70 mg/dl (or below your target range), repeat treatment.  Eat a snack if it is more than one hour until your next meal. Sometimes, your blood glucose may go so low that you are unable to treat yourself. You may need someone to help you. You may even pass out or be unable to swallow. This may require you to get an injection of glucagon, which raises the blood glucose. HOME CARE INSTRUCTIONS  Check blood glucose as recommended by your caregiver.  Take medication as prescribed by your caregiver.  Follow your meal plan. Do not skip meals. Eat on time.  If you are going to drink alcohol, drink it only with meals.  Check your blood glucose before driving.  Check your blood glucose before and after exercise. If you exercise longer or different than usual, be sure to check blood glucose more frequently.  Always carry treatment with you. Glucose tablets are the easiest to carry.  Always wear medical alert jewelry or carry some form of identification that states that you have diabetes. This will alert people that you have diabetes. If you have hypoglycemia, they will have a better idea on what to do. SEEK MEDICAL CARE IF:   You are having problems keeping your blood sugar at target range.  You are having frequent episodes of hypoglycemia.  You feel you might be having side effects from your medicines.  You have symptoms of an illness  that is not improving after 3-4 days.  You notice a change in vision or a new problem with your vision. SEEK IMMEDIATE MEDICAL CARE IF:   You are a family member or friend of a person whose blood glucose goes below 70 mg/dl and is accompanied by:  Confusion.  A change in mental status.  The inability to swallow.  Passing out. Document Released:  01/16/2005 Document Revised: 04/10/2011 Document Reviewed: 05/15/2011 [facility] Patient Information 2014 [place], [place].

## 2013-04-27 NOTE — ED Notes (Signed)
Pt from a group home with caregivers reporting pt had a CBG of 29 this am, then CBG of 50. Orange juice given. 60 of Lantus given at bedtime last pm per norm.

## 2013-04-27 NOTE — ED Provider Notes (Signed)
CSN: [id number]     Arrival date & time 04/27/13  4098 History   First MD Initiated Contact with Patient 04/27/13 1033     Chief Complaint  Patient presents with  . Hypoglycemia     (Consider location/radiation/quality/duration/timing/severity/associated sxs/prior Treatment) HPI  Patient presents to the emergency department from his home with caregivers concern of CBG of 29 this morning. They report that he has been waking up with blood sugars in the 30s every day in the morning for the past 3 weeks which normal response to orange juice and breakfast. However today his  GLUCOSE . Therefore they brought him to the ER for further evaluation his glucose only elevated to mid 50s therefore he was brought to the emergency department for further evaluation. The patient reports feeling no pain and not feeling sick. The group home staff denies that he has been acting different than himself.   Past Medical History  Diagnosis Date  . 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 Mental retardation   . Type I (juvenile type) diabetes mellitus without mention of complication, uncontrolled   . Unspecified congenital anomaly of brain, spinal cord, and nervous system   . Hypertension   . Hyperlipidemia    History reviewed. No pertinent past surgical history. No family history on file. History  Substance Use Topics  . Smoking status: Never Smoker   . Smokeless tobacco: Never Used  . Alcohol Use: No    Review of Systems  Level 5 caveat - TBI, mental retardation  Allergies  Percocet and Tape  Home Medications   Current Outpatient Rx  Name  Route  Sig  Dispense  Refill  . amLODipine (NORVASC) 10 MG tablet   Oral   Take 10 mg by mouth daily.         . benztropine (COGENTIN) 1 MG tablet   Oral   Take 1 mg by mouth 2 (two) times daily.         . cloNIDine (CATAPRES) 0.1 MG tablet   Oral   Take 0.1 mg by mouth 2 (two) times daily.         . divalproex (DEPAKOTE  SPRINKLE) 125 MG capsule   Oral   Take 250 mg by mouth 2 (two) times daily.         . DULoxetine (CYMBALTA) 60 MG capsule   Oral   Take 60 mg by mouth daily.         . fexofenadine (ALLEGRA) 180 MG tablet   Oral   Take 180 mg by mouth daily.         Marland Kitchen gabapentin (NEURONTIN) 600 MG tablet   Oral   Take 1,200 mg by mouth at bedtime.         . insulin aspart (NOVOLOG) 100 UNIT/ML injection   Subcutaneous   Inject 2-10 Units into the skin 3 (three) times daily before meals. Sliding scale. 150-200=2 units, 201-250=4 units, 251-300=6 units, 301-350=8 units, 351-400=10 units         . insulin glargine (LANTUS) 100 UNIT/ML injection   Subcutaneous   Inject 60 Units into the skin at bedtime.         . lamoTRIgine (LAMICTAL) 100 MG tablet   Oral   Take 100 mg by mouth 2 (two) times daily.         Marland Kitchen lisinopril-hydrochlorothiazide (PRINZIDE,ZESTORETIC) 20-12.5 MG per tablet   Oral   Take 1 tablet by mouth daily.         Marland Kitchen  LORazepam (ATIVAN) 1 MG tablet   Oral   Take 1 mg by mouth 2 (two) times daily.         Marland Kitchen. omeprazole (PRILOSEC) 20 MG capsule   Oral   Take 40 mg by mouth daily.         . QUEtiapine (SEROQUEL) 25 MG tablet   Oral   Take 25 mg by mouth daily.         . ziprasidone (GEODON) 80 MG capsule   Oral   Take 80 mg by mouth 2 (two) times daily with a meal.          BP 125/70  Pulse 77  Temp(Src) 97.6 F (36.4 C) (Oral)  Resp 20  SpO2 100% Physical Exam  Nursing note and vitals reviewed. Constitutional: He appears well-developed and well-nourished. No distress.  HENT:  Head: Normocephalic and atraumatic.  Eyes: Pupils are equal, round, and reactive to light.  Neck: Normal range of motion. Neck supple.  Cardiovascular: Normal rate and regular rhythm.   Pulmonary/Chest: Effort normal.  Abdominal: Soft.  Neurological: He is alert.  Skin: Skin is warm and dry.    ED Course  Procedures (including critical care time) Labs  Review Labs Reviewed  CBC WITH DIFFERENTIAL - Abnormal; Notable for the following:    HCT 38.4 (*)    All other components within normal limits  COMPREHENSIVE METABOLIC PANEL - Abnormal; Notable for the following:    Glucose, Bld 165 (*)    Alkaline Phosphatase 154 (*)    Total Bilirubin 0.2 (*)    All other components within normal limits  CBG MONITORING, ED - Abnormal; Notable for the following:    Glucose-Capillary 108 (*)    All other components within normal limits  URINALYSIS, ROUTINE W REFLEX MICROSCOPIC   Imaging Review No results found.   EKG Interpretation None      MDM   Final diagnoses:  Elevated LFTs  Hypoglycemia     CBG is 165 in the emergency department without any intervention. I discussed the case with Dr. Freida BusmanAllen and we will not be adjusting the dose of the patient's Lantus at this time. Will recommend that they would hold tonight's dose of the Lantus and use his NovoLog and sliding scale as previously directed. The patient needs to have followup with the primary care Dr. to discuss adjusting his insulin dosages.  42 y.o.Jeannett SeniorStephen Jallow's evaluation in the Emergency Department is complete. It has been determined that no acute conditions requiring further emergency intervention are present at this time. The patient/guardian have been advised of the diagnosis and plan. We have discussed signs and symptoms that warrant return to the ED, such as changes or worsening in symptoms.  Vital signs are stable at discharge. Filed Vitals:   04/27/13 1003  BP: 125/70  Pulse: 77  Temp: 97.6 F (36.4 C)  Resp: 20    Patient/guardian has voiced understanding and agreed to follow-up with the PCP or specialist.    Dorthula Matasiffany G Irineo Gaulin, PA-C 04/27/13 1210

## 2013-04-27 NOTE — ED Provider Notes (Signed)
Medical screening examination/treatment/procedure(s) were performed by non-physician practitioner and as supervising physician I was immediately available for consultation/collaboration.  Toy BakerAnthony T Adiana Smelcer, MD 04/27/13 [phone]

## 2013-05-07 ENCOUNTER — Encounter (HOSPITAL_COMMUNITY): Payer: Self-pay | Admitting: Emergency Medicine

## 2013-05-07 ENCOUNTER — Emergency Department (HOSPITAL_COMMUNITY)
Admission: EM | Admit: 2013-05-07 | Discharge: 2013-05-07 | Disposition: A | Discharge status: Home / Self Care | Payer: Medicare Other | Attending: Emergency Medicine | Admitting: Emergency Medicine

## 2013-05-07 DIAGNOSIS — E1065 Type 1 diabetes mellitus with hyperglycemia: Secondary | ICD-10-CM | POA: Insufficient documentation

## 2013-05-07 DIAGNOSIS — R739 Hyperglycemia, unspecified: Secondary | ICD-10-CM

## 2013-05-07 DIAGNOSIS — E785 Hyperlipidemia, unspecified: Secondary | ICD-10-CM | POA: Insufficient documentation

## 2013-05-07 DIAGNOSIS — Z87728 Personal history of other specified (corrected) congenital malformations of nervous system and sense organs: Secondary | ICD-10-CM | POA: Insufficient documentation

## 2013-05-07 DIAGNOSIS — IMO0002 Reserved for concepts with insufficient information to code with codable children: Secondary | ICD-10-CM | POA: Insufficient documentation

## 2013-05-07 DIAGNOSIS — Z87820 Personal history of traumatic brain injury: Secondary | ICD-10-CM | POA: Insufficient documentation

## 2013-05-07 DIAGNOSIS — Z794 Long term (current) use of insulin: Secondary | ICD-10-CM | POA: Insufficient documentation

## 2013-05-07 DIAGNOSIS — F79 Unspecified intellectual disabilities: Secondary | ICD-10-CM | POA: Insufficient documentation

## 2013-05-07 DIAGNOSIS — Z79899 Other long term (current) drug therapy: Secondary | ICD-10-CM | POA: Insufficient documentation

## 2013-05-07 DIAGNOSIS — I10 Essential (primary) hypertension: Secondary | ICD-10-CM | POA: Insufficient documentation

## 2013-05-07 LAB — CBC WITH DIFFERENTIAL/PLATELET
BASOS PCT: 1 % (ref 0–1)
Basophils Absolute: 0.1 10*3/uL (ref 0.0–0.1)
Eosinophils Absolute: 0.1 10*3/uL (ref 0.0–0.7)
Eosinophils Relative: 2 % (ref 0–5)
HEMATOCRIT: 39.3 % (ref 39.0–52.0)
Hemoglobin: 13.4 g/dL (ref 13.0–17.0)
LYMPHS ABS: 2.4 10*3/uL (ref 0.7–4.0)
LYMPHS PCT: 36 % (ref 12–46)
MCH: 30 pg (ref 26.0–34.0)
MCHC: 34.1 g/dL (ref 30.0–36.0)
MCV: 88.1 fL (ref 78.0–100.0)
MONO ABS: 0.6 10*3/uL (ref 0.1–1.0)
MONOS PCT: 9 % (ref 3–12)
NEUTROS PCT: 53 % (ref 43–77)
Neutro Abs: 3.6 10*3/uL (ref 1.7–7.7)
Platelets: 356 10*3/uL (ref 150–400)
RBC: 4.46 MIL/uL (ref 4.22–5.81)
RDW: 13.6 % (ref 11.5–15.5)
WBC: 6.7 10*3/uL (ref 4.0–10.5)

## 2013-05-07 LAB — URINALYSIS, ROUTINE W REFLEX MICROSCOPIC
BILIRUBIN URINE: NEGATIVE
Hgb urine dipstick: NEGATIVE
Ketones, ur: NEGATIVE mg/dL
LEUKOCYTES UA: NEGATIVE
NITRITE: NEGATIVE
PH: 6.5 (ref 5.0–8.0)
Protein, ur: NEGATIVE mg/dL
SPECIFIC GRAVITY, URINE: 1.034 — AB (ref 1.005–1.030)
Urobilinogen, UA: 1 mg/dL (ref 0.0–1.0)

## 2013-05-07 LAB — BASIC METABOLIC PANEL
BUN: 11 mg/dL (ref 6–23)
CHLORIDE: 93 meq/L — AB (ref 96–112)
CO2: 27 meq/L (ref 19–32)
CREATININE: 0.79 mg/dL (ref 0.50–1.35)
Calcium: 10 mg/dL (ref 8.4–10.5)
GFR calc Af Amer: >90 mL/min (ref >90)
GFR calc non Af Amer: >90 mL/min (ref >90)
Glucose, Bld: 326 mg/dL — ABNORMAL HIGH (ref 70–99)
POTASSIUM: 4.1 meq/L (ref 3.7–5.3)
Sodium: 134 mEq/L — ABNORMAL LOW (ref 137–147)

## 2013-05-07 LAB — CBG MONITORING, ED
Glucose-Capillary: 343 mg/dL — ABNORMAL HIGH (ref 70–99)
Glucose-Capillary: 278 mg/dL — ABNORMAL HIGH (ref 70–99)

## 2013-05-07 LAB — URINE MICROSCOPIC-ADD ON

## 2013-05-07 MED ORDER — SODIUM CHLORIDE 0.9 % IV BOLUS (SEPSIS)
1000.0000 mL | Freq: Once | INTRAVENOUS | Status: AC
  Administered 2013-05-07: 1000 mL via INTRAVENOUS

## 2013-05-07 NOTE — ED Notes (Signed)
Pt's caregiver states that pt's CBG was 547 about an hour ago.  CBG has come down to 343 now in triage.  Pt's caregiver states that pt is behaving normally.

## 2013-05-07 NOTE — Discharge Instructions (Signed)

## 2013-05-07 NOTE — ED Provider Notes (Signed)
CSN: [id number]     Arrival date & time 05/07/13  1402 History   First MD Initiated Contact with Patient 05/07/13 1620     Chief Complaint  Patient presents with  . Hyperglycemia     (Consider location/radiation/quality/duration/timing/severity/associated sxs/prior Treatment) HPI Comments: Patient presents emergency department with chief complaint of hyperglycemia. He has a history of mental retardation secondary to dramatic brain injury. He is accompanied by caregiver. Caregiver states the patient is blood sugar was 547 about an hour ago. He is also a brittle type I diabetic. He has been seen several times for hypo-glycemia. He was recently taken off Lantus, and has now been having periods of hyperglycemia. He denies any complaints.  The history is provided by the patient. No language interpreter was used.    Past Medical History  Diagnosis Date  . 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 Mental retardation   . Type I (juvenile type) diabetes mellitus without mention of complication, uncontrolled   . Unspecified congenital anomaly of brain, spinal cord, and nervous system   . Hypertension   . Hyperlipidemia    No past surgical history on file. No family history on file. History  Substance Use Topics  . Smoking status: Never Smoker   . Smokeless tobacco: Never Used  . Alcohol Use: No    Review of Systems  Constitutional: Negative for fever and chills.  Respiratory: Negative for shortness of breath.   Cardiovascular: Negative for chest pain.  Gastrointestinal: Negative for nausea, vomiting, diarrhea and constipation.  Genitourinary: Negative for dysuria.      Allergies  Percocet and Tape  Home Medications   Current Outpatient Rx  Name  Route  Sig  Dispense  Refill  . amLODipine (NORVASC) 10 MG tablet   Oral   Take 10 mg by mouth daily.         . benztropine (COGENTIN) 1 MG tablet   Oral   Take 1 mg by mouth 2 (two) times daily.          . cloNIDine (CATAPRES) 0.1 MG tablet   Oral   Take 0.1 mg by mouth 2 (two) times daily.         . divalproex (DEPAKOTE SPRINKLE) 125 MG capsule   Oral   Take 250 mg by mouth 2 (two) times daily.         . DULoxetine (CYMBALTA) 60 MG capsule   Oral   Take 60 mg by mouth daily.         . fexofenadine (ALLEGRA) 180 MG tablet   Oral   Take 180 mg by mouth daily.         Marland Kitchen. gabapentin (NEURONTIN) 600 MG tablet   Oral   Take 1,200 mg by mouth at bedtime.         . insulin aspart (NOVOLOG) 100 UNIT/ML injection   Subcutaneous   Inject 2-10 Units into the skin 3 (three) times daily before meals. Sliding scale. 150-200=2 units, 201-250=4 units, 251-300=6 units, 301-350=8 units, 351-400=10 units         . insulin glargine (LANTUS) 100 UNIT/ML injection   Subcutaneous   Inject 60 Units into the skin at bedtime.         . lamoTRIgine (LAMICTAL) 100 MG tablet   Oral   Take 100 mg by mouth 2 (two) times daily.         Marland Kitchen. lisinopril-hydrochlorothiazide (PRINZIDE,ZESTORETIC) 20-12.5 MG per tablet   Oral   Take 1 tablet by  mouth daily.         Marland Kitchen LORazepam (ATIVAN) 1 MG tablet   Oral   Take 1 mg by mouth 2 (two) times daily.         Marland Kitchen omeprazole (PRILOSEC) 20 MG capsule   Oral   Take 40 mg by mouth daily.         . QUEtiapine (SEROQUEL) 25 MG tablet   Oral   Take 25 mg by mouth daily.         . ziprasidone (GEODON) 80 MG capsule   Oral   Take 80 mg by mouth 2 (two) times daily with a meal.          BP 127/71  Pulse 73  Temp(Src) 98.7 F (37.1 C) (Oral)  Resp 14  SpO2 98% Physical Exam  Nursing note and vitals reviewed. Constitutional: He is oriented to person, place, and time. He appears well-developed and well-nourished.  HENT:  Head: Normocephalic and atraumatic.  Eyes: Conjunctivae and EOM are normal. Pupils are equal, round, and reactive to light. Right eye exhibits no discharge. Left eye exhibits no discharge. No scleral icterus.  Neck:  Normal range of motion. Neck supple. No JVD present.  Cardiovascular: Normal rate, regular rhythm and normal heart sounds.  Exam reveals no gallop and no friction rub.   No murmur heard. Pulmonary/Chest: Effort normal and breath sounds normal. No respiratory distress. He has no wheezes. He has no rales. He exhibits no tenderness.  Abdominal: Soft. He exhibits no distension and no mass. There is no tenderness. There is no rebound and no guarding.  Musculoskeletal: Normal range of motion. He exhibits no edema and no tenderness.  Neurological: He is alert and oriented to person, place, and time.  Skin: Skin is warm and dry.  Psychiatric: He has a normal mood and affect. His behavior is normal. Judgment and thought content normal.    ED Course  Procedures (including critical care time) Results for orders placed during the hospital encounter of 05/07/13  CBC WITH DIFFERENTIAL      Result Value Ref Range   WBC 6.7  4.0 - 10.5 K/uL   RBC 4.46  4.22 - 5.81 MIL/uL   Hemoglobin 13.4  13.0 - 17.0 g/dL   HCT 29.5  18.8 - 41.6 %   MCV 88.1  78.0 - 100.0 fL   MCH 30.0  26.0 - 34.0 pg   MCHC 34.1  30.0 - 36.0 g/dL   RDW 60.6  30.1 - 60.1 %   Platelets 356  150 - 400 K/uL   Neutrophils Relative % 53  43 - 77 %   Neutro Abs 3.6  1.7 - 7.7 K/uL   Lymphocytes Relative 36  12 - 46 %   Lymphs Abs 2.4  0.7 - 4.0 K/uL   Monocytes Relative 9  3 - 12 %   Monocytes Absolute 0.6  0.1 - 1.0 K/uL   Eosinophils Relative 2  0 - 5 %   Eosinophils Absolute 0.1  0.0 - 0.7 K/uL   Basophils Relative 1  0 - 1 %   Basophils Absolute 0.1  0.0 - 0.1 K/uL  BASIC METABOLIC PANEL      Result Value Ref Range   Sodium 134 (*) 137 - 147 mEq/L   Potassium 4.1  3.7 - 5.3 mEq/L   Chloride 93 (*) 96 - 112 mEq/L   CO2 27  19 - 32 mEq/L   Glucose, Bld 326 (*) 70 - 99 mg/dL  BUN 11  6 - 23 mg/dL   Creatinine, Ser 1.61  0.50 - 1.35 mg/dL   Calcium 09.6  8.4 - 04.5 mg/dL   GFR calc non Af Amer >90  >90 mL/min   GFR calc Af  Amer >90  >90 mL/min  URINALYSIS, ROUTINE W REFLEX MICROSCOPIC      Result Value Ref Range   Color, Urine YELLOW  YELLOW   APPearance CLEAR  CLEAR   Specific Gravity, Urine 1.034 (*) 1.005 - 1.030   pH 6.5  5.0 - 8.0   Glucose, UA >1000 (*) NEGATIVE mg/dL   Hgb urine dipstick NEGATIVE  NEGATIVE   Bilirubin Urine NEGATIVE  NEGATIVE   Ketones, ur NEGATIVE  NEGATIVE mg/dL   Protein, ur NEGATIVE  NEGATIVE mg/dL   Urobilinogen, UA 1.0  0.0 - 1.0 mg/dL   Nitrite NEGATIVE  NEGATIVE   Leukocytes, UA NEGATIVE  NEGATIVE  URINE MICROSCOPIC-ADD ON      Result Value Ref Range   WBC, UA 0-2  <3 WBC/hpf  CBG MONITORING, ED      Result Value Ref Range   Glucose-Capillary 343 (*) 70 - 99 mg/dL   Comment 1 Documented in Chart     Comment 2 Notify RN    CBG MONITORING, ED      Result Value Ref Range   Glucose-Capillary 278 (*) 70 - 99 mg/dL   Comment 1 Notify RN     No results found.    EKG Interpretation None      MDM   Final diagnoses:  Hyperglycemia    Patient is a brittle diabetic, who recently has Lantus discontinued by his PCP because he was having bouts of hypoglycemia. No hyperglycemic 500s. Will give the patient some fluids, and anticipate discharge with PCP followup. Will check basic labs, and insuring that he is not acidotic. He looks well. Is not in any apparent distress.  6:23 PM Glucose is trending down nicely. And gap is 14. Patient is not acidotic. He is not in any apparent distress. Discharge to home with primary care followup this week. Caregiver understands and agrees with plan.  Discussed the patient with Dr. Elesa Massed, who agrees with the plan.   Roxy Horseman, PA-C 05/07/13 [phone]

## 2013-05-07 NOTE — ED Provider Notes (Signed)
Medical screening examination/treatment/procedure(s) were performed by non-physician practitioner and as supervising physician I was immediately available for consultation/collaboration.   EKG Interpretation None        Layla MawKristen N Arita Severtson, DO 05/07/13 2347

## 2013-09-23 ENCOUNTER — Encounter (HOSPITAL_COMMUNITY): Payer: Self-pay | Admitting: Emergency Medicine

## 2013-09-23 ENCOUNTER — Emergency Department (HOSPITAL_COMMUNITY)
Admission: EM | Admit: 2013-09-23 | Discharge: 2013-09-23 | Disposition: A | Discharge status: Home / Self Care | Payer: Medicare Other | Attending: Emergency Medicine | Admitting: Emergency Medicine

## 2013-09-23 DIAGNOSIS — S0083XA Contusion of other part of head, initial encounter: Secondary | ICD-10-CM | POA: Diagnosis not present

## 2013-09-23 DIAGNOSIS — S0181XA Laceration without foreign body of other part of head, initial encounter: Secondary | ICD-10-CM

## 2013-09-23 DIAGNOSIS — E1065 Type 1 diabetes mellitus with hyperglycemia: Secondary | ICD-10-CM | POA: Diagnosis not present

## 2013-09-23 DIAGNOSIS — S0180XA Unspecified open wound of other part of head, initial encounter: Secondary | ICD-10-CM | POA: Insufficient documentation

## 2013-09-23 DIAGNOSIS — Z8659 Personal history of other mental and behavioral disorders: Secondary | ICD-10-CM | POA: Diagnosis not present

## 2013-09-23 DIAGNOSIS — Z87820 Personal history of traumatic brain injury: Secondary | ICD-10-CM | POA: Insufficient documentation

## 2013-09-23 DIAGNOSIS — Y9389 Activity, other specified: Secondary | ICD-10-CM | POA: Insufficient documentation

## 2013-09-23 DIAGNOSIS — S0990XA Unspecified injury of head, initial encounter: Secondary | ICD-10-CM | POA: Insufficient documentation

## 2013-09-23 DIAGNOSIS — S0003XA Contusion of scalp, initial encounter: Secondary | ICD-10-CM | POA: Insufficient documentation

## 2013-09-23 DIAGNOSIS — Z794 Long term (current) use of insulin: Secondary | ICD-10-CM | POA: Insufficient documentation

## 2013-09-23 DIAGNOSIS — Z79899 Other long term (current) drug therapy: Secondary | ICD-10-CM | POA: Diagnosis not present

## 2013-09-23 DIAGNOSIS — Y9289 Other specified places as the place of occurrence of the external cause: Secondary | ICD-10-CM | POA: Diagnosis not present

## 2013-09-23 DIAGNOSIS — IMO0002 Reserved for concepts with insufficient information to code with codable children: Secondary | ICD-10-CM | POA: Insufficient documentation

## 2013-09-23 DIAGNOSIS — I10 Essential (primary) hypertension: Secondary | ICD-10-CM | POA: Insufficient documentation

## 2013-09-23 DIAGNOSIS — S1093XA Contusion of unspecified part of neck, initial encounter: Secondary | ICD-10-CM

## 2013-09-23 NOTE — Discharge Instructions (Signed)
Contusion °A contusion is a deep bruise. Contusions are the result of an injury that caused bleeding under the skin. The contusion may turn blue, purple, or yellow. Minor injuries will give you a painless contusion, but more severe contusions may stay painful and swollen for a few weeks.  °CAUSES  °A contusion is usually caused by a blow, trauma, or direct force to an area of the body. °SYMPTOMS  °· Swelling and redness of the injured area. °· Bruising of the injured area. °· Tenderness and soreness of the injured area. °· Pain. °DIAGNOSIS  °The diagnosis can be made by taking a history and physical exam. An X-ray, CT scan, or MRI may be needed to determine if there were any associated injuries, such as fractures. °TREATMENT  °Specific treatment will depend on what area of the body was injured. In general, the best treatment for a contusion is resting, icing, elevating, and applying cold compresses to the injured area. Over-the-counter medicines may also be recommended for pain control. Ask your caregiver what the best treatment is for your contusion. °HOME CARE INSTRUCTIONS  °· Put ice on the injured area. °· Put ice in a plastic bag. °· Place a towel between your skin and the bag. °· Leave the ice on for 15-20 minutes, 3-4 times a day, or as directed by your health care provider. °· Only take over-the-counter or prescription medicines for pain, discomfort, or fever as directed by your caregiver. Your caregiver may recommend avoiding anti-inflammatory medicines (aspirin, ibuprofen, and naproxen) for 48 hours because these medicines may increase bruising. °· Rest the injured area. °· If possible, elevate the injured area to reduce swelling. °SEEK IMMEDIATE MEDICAL CARE IF:  °· You have increased bruising or swelling. °· You have pain that is getting worse. °· Your swelling or pain is not relieved with medicines. °MAKE SURE YOU:  °· Understand these instructions. °· Will watch your condition. °· Will get help right  away if you are not doing well or get worse. °Document Released: 10/26/2004 Document Revised: 01/21/2013 Document Reviewed: 11/21/2010 °[employer]® Patient Information ©2015 [employer]. This information is not intended to replace advice given to you by your health care provider. Make sure you discuss any questions you have with your health care provider. °Tissue Adhesive Wound Care °Some cuts, wounds, lacerations, and incisions can be repaired by using tissue adhesive. Tissue adhesive is like glue. It holds the skin together, allowing for faster healing. It forms a strong bond on the skin in about 1 minute and reaches its full strength in about 2 or 3 minutes. The adhesive disappears naturally while the wound is healing. It is important to take proper care of your wound at home while it heals.  °HOME CARE INSTRUCTIONS  °· Showers are allowed. Do not soak the area containing the tissue adhesive. Do not take baths, swim, or use hot tubs. Do not use any soaps or ointments on the wound. Certain ointments can weaken the glue. °· If a bandage (dressing) has been applied, follow your health care provider's instructions for how often to change the dressing.   °· Keep the dressing dry if one has been applied.   °· Do not scratch, pick, or rub the adhesive.   °· Do not place tape over the adhesive. The adhesive could come off when pulling the tape off.   °· Protect the wound from further injury until it is healed.   °· Protect the wound from sun and tanning bed exposure while it is healing and for several   weeks after healing.   °· Only take over-the-counter or prescription medicines as directed by your health care provider.   °· Keep all follow-up appointments as directed by your health care provider. °SEEK IMMEDIATE MEDICAL CARE IF:  °· Your wound becomes red, swollen, hot, or tender.   °· You develop a rash after the glue is applied. °· You have increasing pain in the wound.   °· You have a red streak that goes away from  the wound.   °· You have pus coming from the wound.   °· You have increased bleeding. °· You have a fever. °· You have shaking chills.   °· You notice a bad smell coming from the wound.   °· Your wound or adhesive breaks open.   °MAKE SURE YOU:  °· Understand these instructions. °· Will watch your condition. °· Will get help right away if you are not doing well or get worse. °Document Released: 07/12/2000 Document Revised: 11/06/2012 Document Reviewed: 08/07/2012 °[employer]® Patient Information ©2015 [employer]. This information is not intended to replace advice given to you by your health care provider. Make sure you discuss any questions you have with your health care provider. ° °

## 2013-09-23 NOTE — ED Notes (Signed)
Per pt's caregiver, pt is from [facility] and has MR. Pt has certain behaviors that he performs and one of these is banging his head on the wall or the floor. Pt began doing that this evening and hit his head on the door frame causing a small laceration to his left forehead. Pt is unable to answer questions but is calm and cooperative upon assessment.

## 2013-09-23 NOTE — ED Provider Notes (Signed)
CSN: [id number]     Arrival date & time 09/23/13  1936 History   First MD Initiated Contact with Patient 09/23/13 2056     Chief Complaint  Patient presents with  . Head Injury     (Consider location/radiation/quality/duration/timing/severity/associated sxs/prior Treatment) Patient is a 43 y.o. male presenting with head injury. The history is provided by the patient and a caregiver. No language interpreter was used.  Head Injury Location:  Frontal Mechanism of injury: direct blow   Associated symptoms comment:  The patient has a history of mental retardation and resides in a group home. He became upset today and started to hit his head against the wall per his usual behavior, sustaining a small laceration to forehead with swelling. No LOC, vomiting or mental status changes. He has continued to be normally active since the injury.   Past Medical History  Diagnosis Date  . 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 Mental retardation   . Type I (juvenile type) diabetes mellitus without mention of complication, uncontrolled   . Unspecified congenital anomaly of brain, spinal cord, and nervous system   . Hypertension   . Hyperlipidemia    History reviewed. No pertinent past surgical history. No family history on file. History  Substance Use Topics  . Smoking status: Never Smoker   . Smokeless tobacco: Never Used  . Alcohol Use: No    Review of Systems  Unable to perform ROS: Other      Allergies  Percocet and Tape  Home Medications   Prior to Admission medications   Medication Sig Start Date End Date Taking? Authorizing Provider  amLODipine (NORVASC) 10 MG tablet Take 10 mg by mouth daily.    Historical Provider, MD  benztropine (COGENTIN) 1 MG tablet Take 1 mg by mouth 2 (two) times daily.    Historical Provider, MD  cloNIDine (CATAPRES) 0.1 MG tablet Take 0.1 mg by mouth 2 (two) times daily.    Historical Provider, MD  divalproex (DEPAKOTE  SPRINKLE) 125 MG capsule Take 250 mg by mouth 2 (two) times daily.    Historical Provider, MD  DULoxetine (CYMBALTA) 60 MG capsule Take 60 mg by mouth daily.    Historical Provider, MD  fexofenadine (ALLEGRA) 180 MG tablet Take 180 mg by mouth daily.    Historical Provider, MD  gabapentin (NEURONTIN) 600 MG tablet Take 1,200 mg by mouth at bedtime.    Historical Provider, MD  insulin aspart (NOVOLOG) 100 UNIT/ML injection Inject 2-10 Units into the skin 3 (three) times daily before meals. Sliding scale. 150-200=2 units, 201-250=4 units, 251-300=6 units, 301-350=8 units, 351-400=10 units    Historical Provider, MD  insulin glargine (LANTUS) 100 UNIT/ML injection Inject 60 Units into the skin at bedtime.    Historical Provider, MD  lamoTRIgine (LAMICTAL) 100 MG tablet Take 100 mg by mouth 2 (two) times daily.    Historical Provider, MD  lisinopril-hydrochlorothiazide (PRINZIDE,ZESTORETIC) 20-12.5 MG per tablet Take 1 tablet by mouth daily.    Historical Provider, MD  LORazepam (ATIVAN) 1 MG tablet Take 1 mg by mouth 2 (two) times daily.    Historical Provider, MD  omeprazole (PRILOSEC) 20 MG capsule Take 40 mg by mouth daily.    Historical Provider, MD  QUEtiapine (SEROQUEL) 25 MG tablet Take 50 mg by mouth at bedtime.     Historical Provider, MD  ziprasidone (GEODON) 80 MG capsule Take 80 mg by mouth 2 (two) times daily with a meal.    Historical Provider, MD  BP 146/84  Pulse 91  Temp(Src) 98.8 F (37.1 C) (Oral)  Resp 16  SpO2 100% Physical Exam  Constitutional: He appears well-developed and well-nourished. No distress.  HENT:  Head: Normocephalic.  Eyes: Conjunctivae and EOM are normal. Pupils are equal, round, and reactive to light.  Neck: Normal range of motion. Neck supple.  Pulmonary/Chest: Effort normal.  Neurological: He is alert.  He engages in answering questions, is cooperative.   Skin: Skin is warm and dry.  1 cm superficial laceration central forehead with associated  hematoma.   Psychiatric: He has a normal mood and affect.    ED Course  Procedures (including critical care time) Labs Review Labs Reviewed - No data to display  Imaging Review No results found.   EKG Interpretation None     LACERATION REPAIR Performed by: Elpidio Anis A Authorized by: Elpidio Anis A Consent: Verbal consent obtained. Risks and benefits: risks, benefits and alternatives were discussed Consent given by: patient Patient identity confirmed: provided demographic data Prepped and Draped in normal sterile fashion Wound explored  Laceration Location: forehead  Laceration Length: 1cm  No Foreign Bodies seen or palpated  Anesthesia: local infiltration  Local anesthetic: lidocaine na% na epinephrine  Anesthetic total: na ml  Irrigation method: syringe Amount of cleaning: standard  Skin closure: dermabond  Number of sutures: na  Technique: dermabond  Patient tolerance: Patient tolerated the procedure well with no immediate complications.  MDM   Final diagnoses:  None    1. Facial laceration 2. Forehead contusion  The patient did not lose consciousness during head banging episode and has behaved per his usual/normal behavior during and since the incident. No concern for IC head injury. Laceration repaired with dermabond. Patient stable for discharge.     Arnoldo Hooker, PA-C 09/23/13 2145

## 2013-09-27 NOTE — ED Provider Notes (Signed)
Medical screening examination/treatment/procedure(s) were performed by non-physician practitioner and as supervising physician I was immediately available for consultation/collaboration.   EKG Interpretation None        Mirian Mo, MD 09/27/13 [phone]

## 2013-11-02 ENCOUNTER — Emergency Department (HOSPITAL_COMMUNITY): Payer: Medicare Other

## 2013-11-02 ENCOUNTER — Encounter (HOSPITAL_COMMUNITY): Payer: Self-pay | Admitting: Emergency Medicine

## 2013-11-02 ENCOUNTER — Emergency Department (HOSPITAL_COMMUNITY)
Admission: EM | Admit: 2013-11-02 | Discharge: 2013-11-03 | Disposition: A | Discharge status: Home / Self Care | Payer: Medicare Other | Attending: Emergency Medicine | Admitting: Emergency Medicine

## 2013-11-02 DIAGNOSIS — F79 Unspecified intellectual disabilities: Secondary | ICD-10-CM | POA: Diagnosis not present

## 2013-11-02 DIAGNOSIS — Y9389 Activity, other specified: Secondary | ICD-10-CM | POA: Insufficient documentation

## 2013-11-02 DIAGNOSIS — Z794 Long term (current) use of insulin: Secondary | ICD-10-CM | POA: Insufficient documentation

## 2013-11-02 DIAGNOSIS — S0990XA Unspecified injury of head, initial encounter: Secondary | ICD-10-CM

## 2013-11-02 DIAGNOSIS — Z7952 Long term (current) use of systemic steroids: Secondary | ICD-10-CM | POA: Diagnosis not present

## 2013-11-02 DIAGNOSIS — Y9289 Other specified places as the place of occurrence of the external cause: Secondary | ICD-10-CM | POA: Diagnosis not present

## 2013-11-02 DIAGNOSIS — R1110 Vomiting, unspecified: Secondary | ICD-10-CM | POA: Diagnosis not present

## 2013-11-02 DIAGNOSIS — Q079 Congenital malformation of nervous system, unspecified: Secondary | ICD-10-CM | POA: Diagnosis not present

## 2013-11-02 DIAGNOSIS — I10 Essential (primary) hypertension: Secondary | ICD-10-CM | POA: Diagnosis not present

## 2013-11-02 DIAGNOSIS — Z79899 Other long term (current) drug therapy: Secondary | ICD-10-CM | POA: Insufficient documentation

## 2013-11-02 DIAGNOSIS — T148XXA Other injury of unspecified body region, initial encounter: Secondary | ICD-10-CM

## 2013-11-02 DIAGNOSIS — W2201XA Walked into wall, initial encounter: Secondary | ICD-10-CM | POA: Insufficient documentation

## 2013-11-02 DIAGNOSIS — Z87820 Personal history of traumatic brain injury: Secondary | ICD-10-CM | POA: Diagnosis not present

## 2013-11-02 DIAGNOSIS — S0091XA Abrasion of unspecified part of head, initial encounter: Secondary | ICD-10-CM | POA: Insufficient documentation

## 2013-11-02 DIAGNOSIS — E1065 Type 1 diabetes mellitus with hyperglycemia: Secondary | ICD-10-CM | POA: Insufficient documentation

## 2013-11-02 LAB — CBG MONITORING, ED: Glucose-Capillary: 134 mg/dL — ABNORMAL HIGH (ref 70–99)

## 2013-11-02 NOTE — ED Provider Notes (Signed)
CSN: [id number]     Arrival date & time 11/02/13  2126 History   First MD Initiated Contact with Patient 11/02/13 2212     Chief Complaint  Patient presents with  . Head Injury     (Consider location/radiation/quality/duration/timing/severity/associated sxs/prior Treatment) HPI Comments: Patient is in group home with Dx of MR and TRI --has Hx of head banging when frustrated  Tonight was banging his head on the wall hard enough to cause an abrasion/bleeding.  Since that time has been in normal state of health but in ED vomited X1  Patient is a 43 y.o. male presenting with head injury. The history is provided by the patient. The history is limited by the condition of the patient.  Head Injury Location:  Frontal Mechanism of injury: self-inflicted   Pain details:    Quality:  Unable to specify   Severity:  Unable to specify   Timing:  Unable to specify   Progression:  Unable to specify Relieved by:  None tried Worsened by:  Nothing tried Associated symptoms: vomiting     Past Medical History  Diagnosis Date  . 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 Mental retardation   . Type I (juvenile type) diabetes mellitus without mention of complication, uncontrolled   . Unspecified congenital anomaly of brain, spinal cord, and nervous system   . Hypertension   . Hyperlipidemia    History reviewed. No pertinent past surgical history. History reviewed. No pertinent family history. History  Substance Use Topics  . Smoking status: Never Smoker   . Smokeless tobacco: Never Used  . Alcohol Use: No    Review of Systems  Unable to perform ROS: Patient nonverbal  Constitutional: Negative for fever.  Gastrointestinal: Positive for vomiting.  Skin: Positive for wound.  All other systems reviewed and are negative.     Allergies  Percocet and Tape  Home Medications   Prior to Admission medications   Medication Sig Start Date End Date Taking? Authorizing  Provider  acetaminophen (TYLENOL) 325 MG tablet Take 650 mg by mouth every 4 (four) hours as needed (for pain/fever/headache/flu-like symptoms).   Yes Historical Provider, MD  amLODipine (NORVASC) 10 MG tablet Take 10 mg by mouth daily.   Yes Historical Provider, MD  benztropine (COGENTIN) 1 MG tablet Take 1 mg by mouth 2 (two) times daily.   Yes Historical Provider, MD  cloNIDine (CATAPRES) 0.1 MG tablet Take 0.1 mg by mouth 3 (three) times daily.    Yes Historical Provider, MD  divalproex (DEPAKOTE SPRINKLE) 125 MG capsule Take 250 mg by mouth 2 (two) times daily.   Yes Historical Provider, MD  DULoxetine (CYMBALTA) 60 MG capsule Take 60 mg by mouth daily.   Yes Historical Provider, MD  fexofenadine (ALLEGRA) 180 MG tablet Take 180 mg by mouth daily.   Yes Historical Provider, MD  gabapentin (NEURONTIN) 600 MG tablet Take 1,200 mg by mouth at bedtime.   Yes Historical Provider, MD  insulin aspart (NOVOLOG) 100 UNIT/ML injection Inject 2-10 Units into the skin 3 (three) times daily before meals. Sliding scale. 150-200=2 units, 201-250=4 units, 251-300=6 units, 301-350=8 units, 351-400=10 units   Yes Historical Provider, MD  insulin glargine (LANTUS) 100 UNIT/ML injection Inject 30 Units into the skin at bedtime.    Yes Historical Provider, MD  lamoTRIgine (LAMICTAL) 100 MG tablet Take 100 mg by mouth 2 (two) times daily.   Yes Historical Provider, MD  lisinopril-hydrochlorothiazide (PRINZIDE,ZESTORETIC) 20-12.5 MG per tablet Take 1 tablet by mouth  daily.   Yes Historical Provider, MD  LORazepam (ATIVAN) 1 MG tablet Take 1 mg by mouth 2 (two) times daily.   Yes Historical Provider, MD  omeprazole (PRILOSEC) 20 MG capsule Take 40 mg by mouth daily.   Yes Historical Provider, MD  QUEtiapine (SEROQUEL) 100 MG tablet Take 100 mg by mouth at bedtime.   Yes Historical Provider, MD  ziprasidone (GEODON) 80 MG capsule Take 80 mg by mouth daily.    Yes Historical Provider, MD  benzoyl peroxide 10 % gel Apply  1-2 application topically daily as needed.    Historical Provider, MD  benzoyl peroxide 5 % gel Apply 1-2 application topically daily as needed.    Historical Provider, MD  chlorpheniramine (CHLOR-TRIMETON) 4 MG tablet Take 4 mg by mouth every 6 (six) hours as needed for allergies.    Historical Provider, MD  Hydrocortisone Acetate 1 % OINT Apply 1 application topically as needed (for hemorrhoids up to 5 times daily).    Historical Provider, MD  loperamide (IMODIUM) 2 MG capsule Take 2 mg by mouth as needed for diarrhea or loose stools.    Historical Provider, MD  sodium fluoride (SF 5000 PLUS) 1.1 % CREA dental cream Place 1 application onto teeth every evening.    Historical Provider, MD   BP 121/82  Pulse 91  Temp(Src) 98.2 F (36.8 C) (Oral)  Resp 18  Wt 156 lb 6.4 oz (70.943 kg)  SpO2 98% Physical Exam  Vitals reviewed. Constitutional: He appears well-developed.  HENT:  Head: Normocephalic.  Eyes: Pupils are equal, round, and reactive to light.  Neck: Normal range of motion.  Cardiovascular: Normal rate.   Pulmonary/Chest: Effort normal.  Abdominal: He exhibits no distension. There is no tenderness.  Musculoskeletal: Normal range of motion.  Neurological: He is alert.  Skin: Skin is warm.    ED Course  Procedures (including critical care time) Labs Review Labs Reviewed  CBG MONITORING, ED - Abnormal; Notable for the following:    Glucose-Capillary 134 (*)    All other components within normal limits    Imaging Review Ct Head Wo Contrast  11/03/2013   CLINICAL DATA:  Initial encounter for blunt trauma to the head. Self induced trauma. The patient hit his head against a wall in a group home. Soft tissue swelling of the forehead and blood to the nose.  EXAM: CT HEAD WITHOUT CONTRAST  TECHNIQUE: Contiguous axial images were obtained from the base of the skull through the vertex without intravenous contrast.  COMPARISON:  CT head without contrast 03/04/2013.  FINDINGS: No  acute infarct, hemorrhage, or mass lesion is present. The ventricles are of normal size. No significant extraaxial fluid collection is present. Soft tissue swelling and hematoma is noted over the anterior frontal scalp extending to the vertex. There is no underlying fracture. The visualized paranasal sinuses and the mastoid air cells are clear. The osseous skull is intact.  IMPRESSION: 1. Soft tissue swelling and hematoma within the midline frontal scalp without an underlying fracture. 2. Normal CT appearance of the brain.   Electronically Signed   By: Gennette Pachris  Mattern M.D.   On: 11/03/2013 00:07     EKG Interpretation None     Will CT head due to vomiting in ED facial wound minimal  MDM   Final diagnoses:  Minor head injury without loss of consciousness, initial encounter  Abrasion         Arman FilterGail K Shalisa Mcquade, NP 11/03/13 [id number]

## 2013-11-02 NOTE — ED Notes (Signed)
Patient is from a group home.  His caseworker states that one of his frequent behaviors is hitting his head against the wall.  He did so this evening and he has dried blood on his nose and above his nose.

## 2013-11-03 NOTE — ED Provider Notes (Signed)
Medical screening examination/treatment/procedure(s) were performed by non-physician practitioner and as supervising physician I was immediately available for consultation/collaboration.   EKG Interpretation None       Aliyha Fornes M Akire Rennert, MD 11/03/13 1703 

## 2013-11-03 NOTE — Discharge Instructions (Signed)
Abrasions An abrasion is a cut or scrape of the skin. Abrasions do not go through all layers of the skin. HOME CARE  If a bandage (dressing) was put on your wound, change it as told by your doctor. If the bandage sticks, soak it off with warm.  Wash the area with water and soap 2 times a day. Rinse off the soap. Pat the area dry with a clean towel.  Put on medicated cream (ointment) as told by your doctor.  Change your bandage right away if it gets wet or dirty.  Only take medicine as told by your doctor.  See your doctor within 24-48 hours to get your wound checked.  Check your wound for redness, puffiness (swelling), or yellowish-white fluid (pus). GET HELP RIGHT AWAY IF:   You have more pain in the wound.  You have redness, swelling, or tenderness around the wound.  You have pus coming from the wound.  You have a fever or lasting symptoms for more than 2-3 days.  You have a fever and your symptoms suddenly get worse.  You have a bad smell coming from the wound or bandage. MAKE SURE YOU:   Understand these instructions.  Will watch your condition.  Will get help right away if you are not doing well or get worse. Document Released: 07/05/2007 Document Revised: 10/11/2011 Document Reviewed: 12/20/2010 [facility] Patient Information 2015 [place], [place]. This information is not intended to replace advice given to you by your health care provider. Make sure you discuss any questions you have with your health care provider.  Blunt Trauma You have been evaluated for injuries. You have been examined and your caregiver has not found injuries serious enough to require hospitalization. It is common to have multiple bruises and sore muscles following an accident. These tend to feel worse for the first 24 hours. You will feel more stiffness and soreness over the next several hours and worse when you wake up the first morning after your accident. After this point, you should begin to  improve with each passing day. The amount of improvement depends on the amount of damage done in the accident. Following your accident, if some part of your body does not work as it should, or if the pain in any area continues to increase, you should return to the Emergency Department for re-evaluation.  HOME CARE INSTRUCTIONS  Routine care for sore areas should include:  Ice to sore areas every 2 hours for 20 minutes while awake for the next 2 days.  Drink extra fluids (not alcohol).  Take a hot or warm shower or bath once or twice a day to increase blood flow to sore muscles. This will help you "limber up".  Activity as tolerated. Lifting may aggravate neck or back pain.  Only take over-the-counter or prescription medicines for pain, discomfort, or fever as directed by your caregiver. Do not use aspirin. This may increase bruising or increase bleeding if there are small areas where this is happening. SEEK IMMEDIATE MEDICAL CARE IF:  Numbness, tingling, weakness, or problem with the use of your arms or legs.  A severe headache is not relieved with medications.  There is a change in bowel or bladder control.  Increasing pain in any areas of the body.  Short of breath or dizzy.  Nauseated, vomiting, or sweating.  Increasing belly (abdominal) discomfort.  Blood in urine, stool, or vomiting blood.  Pain in either shoulder in an area where a shoulder strap would be.  Feelings  of lightheadedness or if you have a fainting episode. Sometimes it is not possible to identify all injuries immediately after the trauma. It is important that you continue to monitor your condition after the emergency department visit. If you feel you are not improving, or improving more slowly than should be expected, call your physician. If you feel your symptoms (problems) are worsening, return to the Emergency Department immediately. Document Released: 10/12/2000 Document Revised: 04/10/2011 Document  Reviewed: 09/04/2007 [facility] Patient Information 2015 [place]. This information is not intended to replace advice given to you by your health care provider. Make sure you discuss any questions you have with your health care provider. Head CT Scan is normal

## 2014-07-19 IMAGING — CR DG HAND COMPLETE 3+V*L*
3 series · 3 of 3 positions shown · non-contrast
Comparison: None.

CLINICAL DATA: Trauma

EXAM:
LEFT HAND - COMPLETE 3+ VIEW

[x hand pa left]
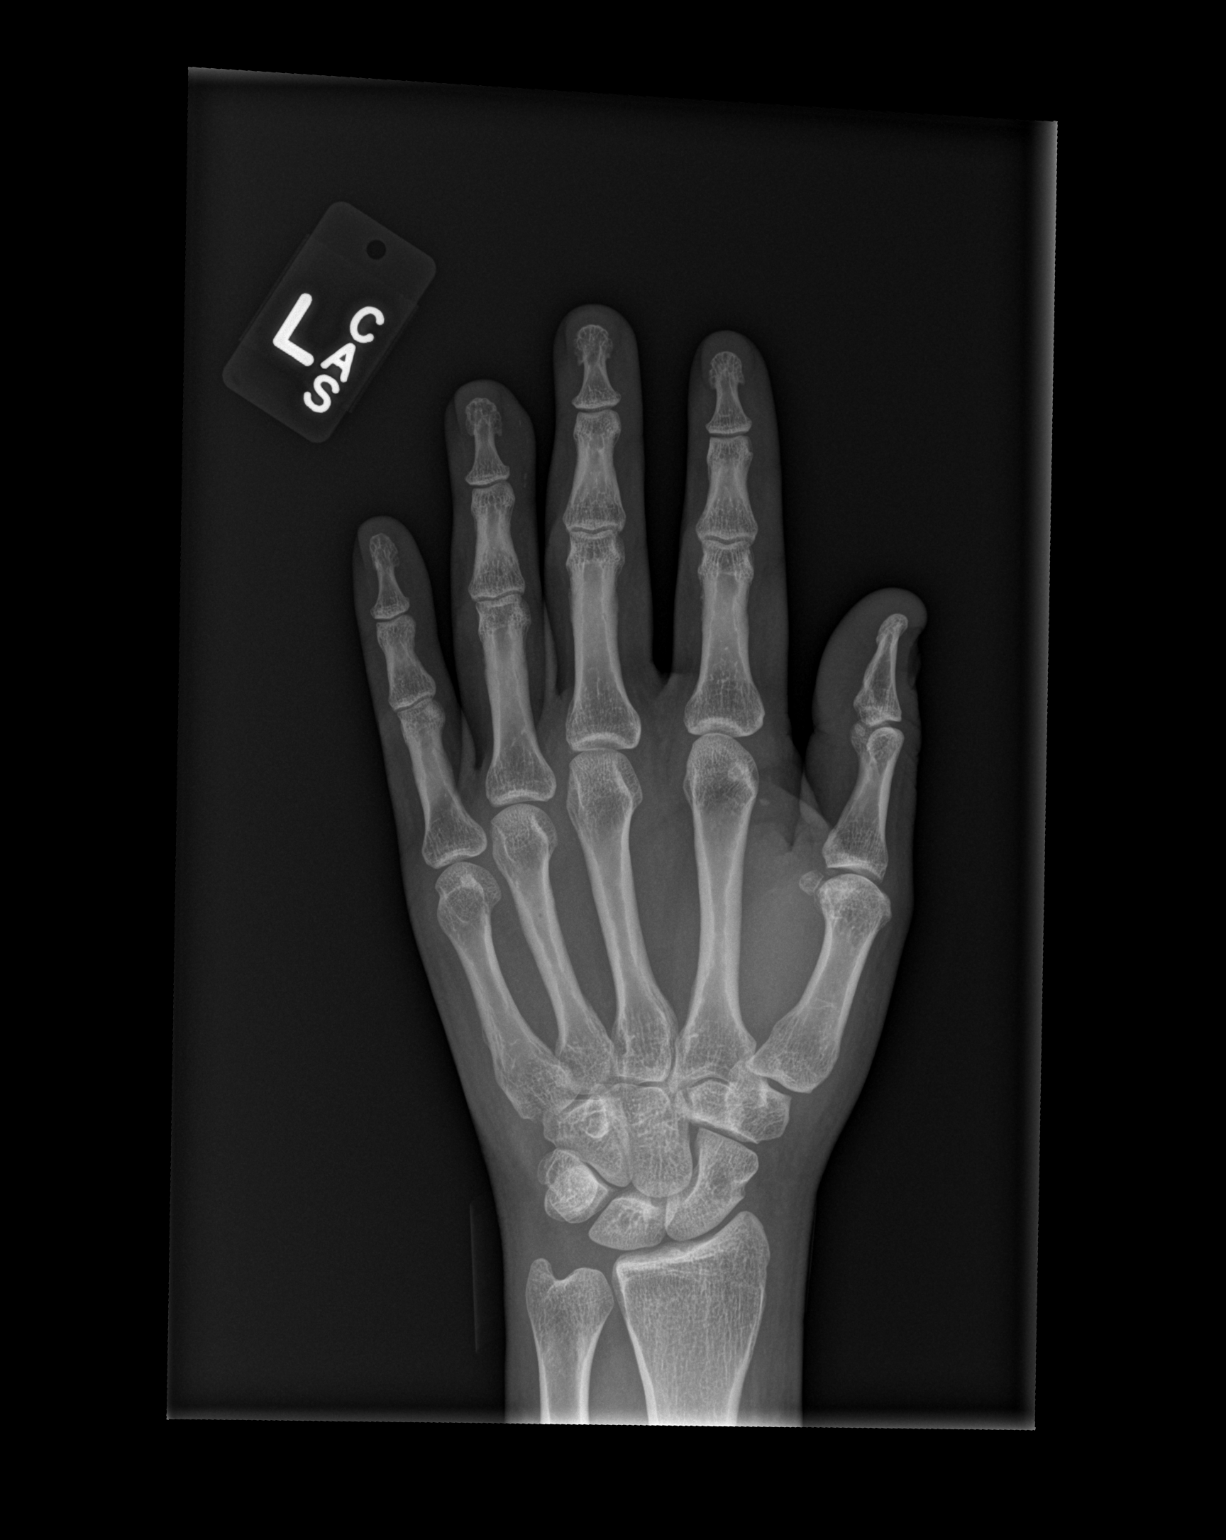

[x hand obl left]
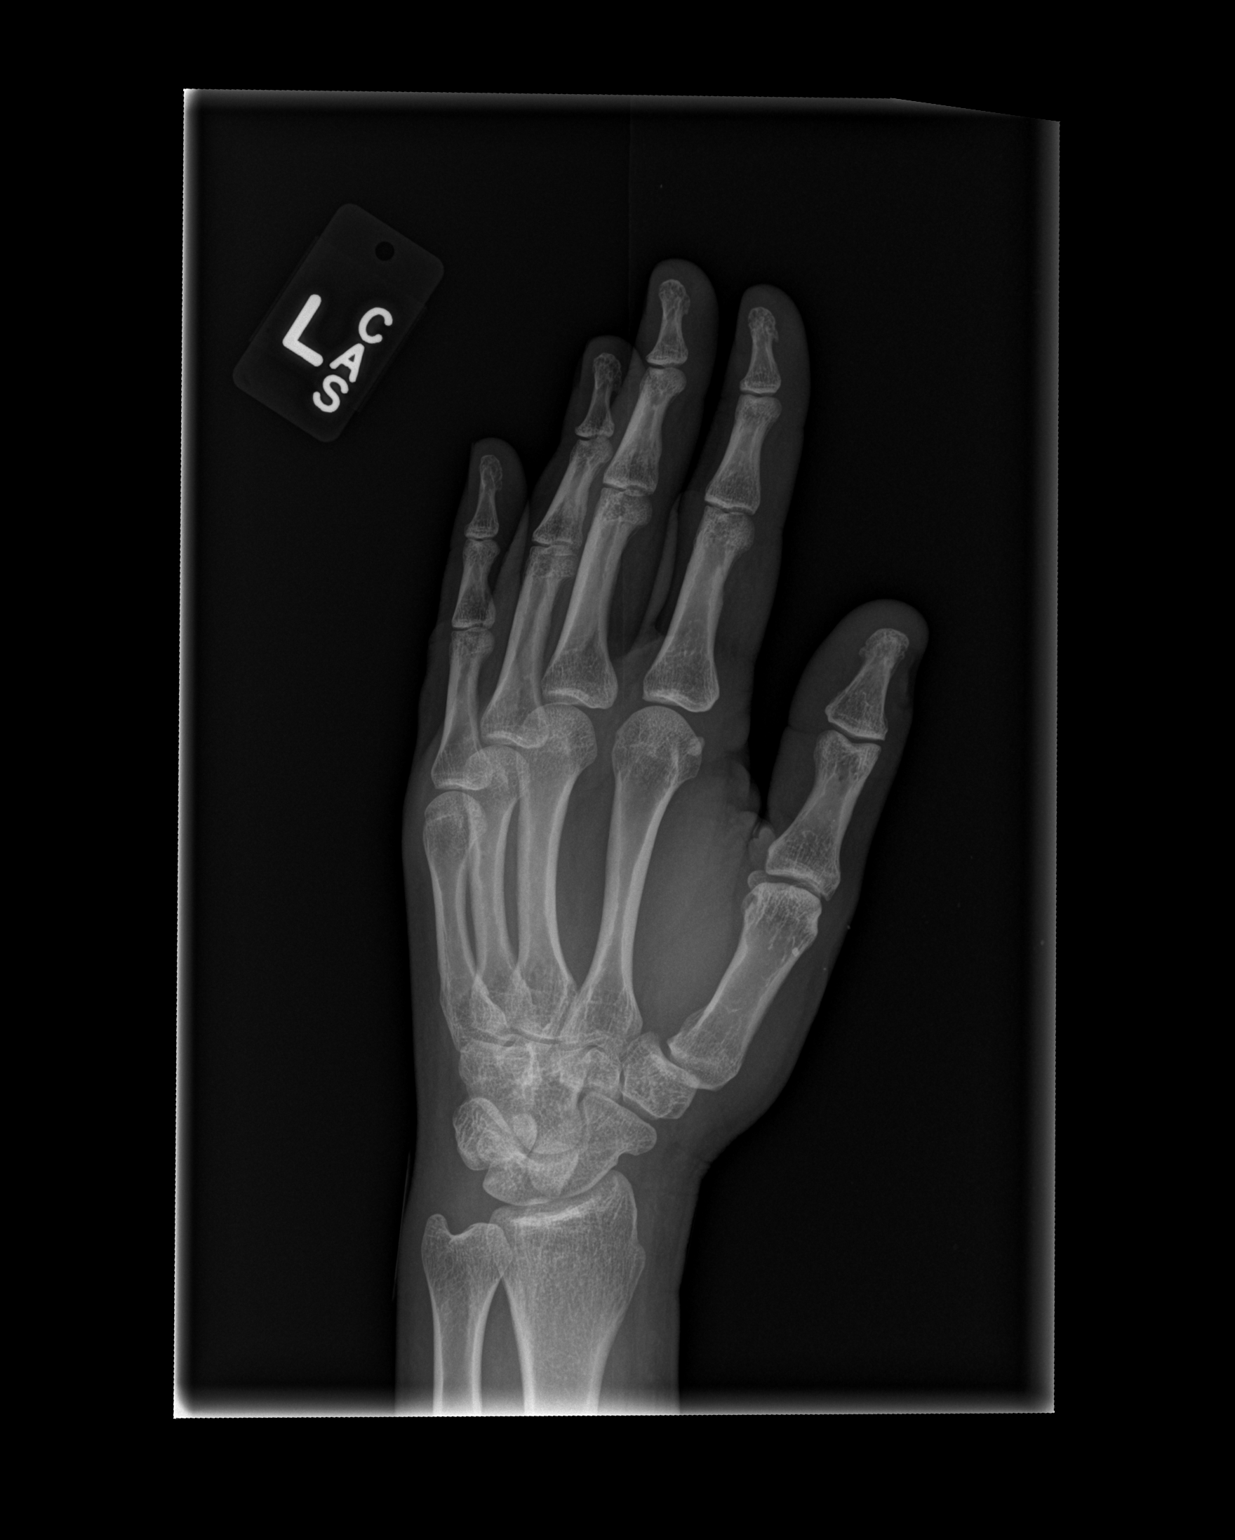

[x hand lat left]
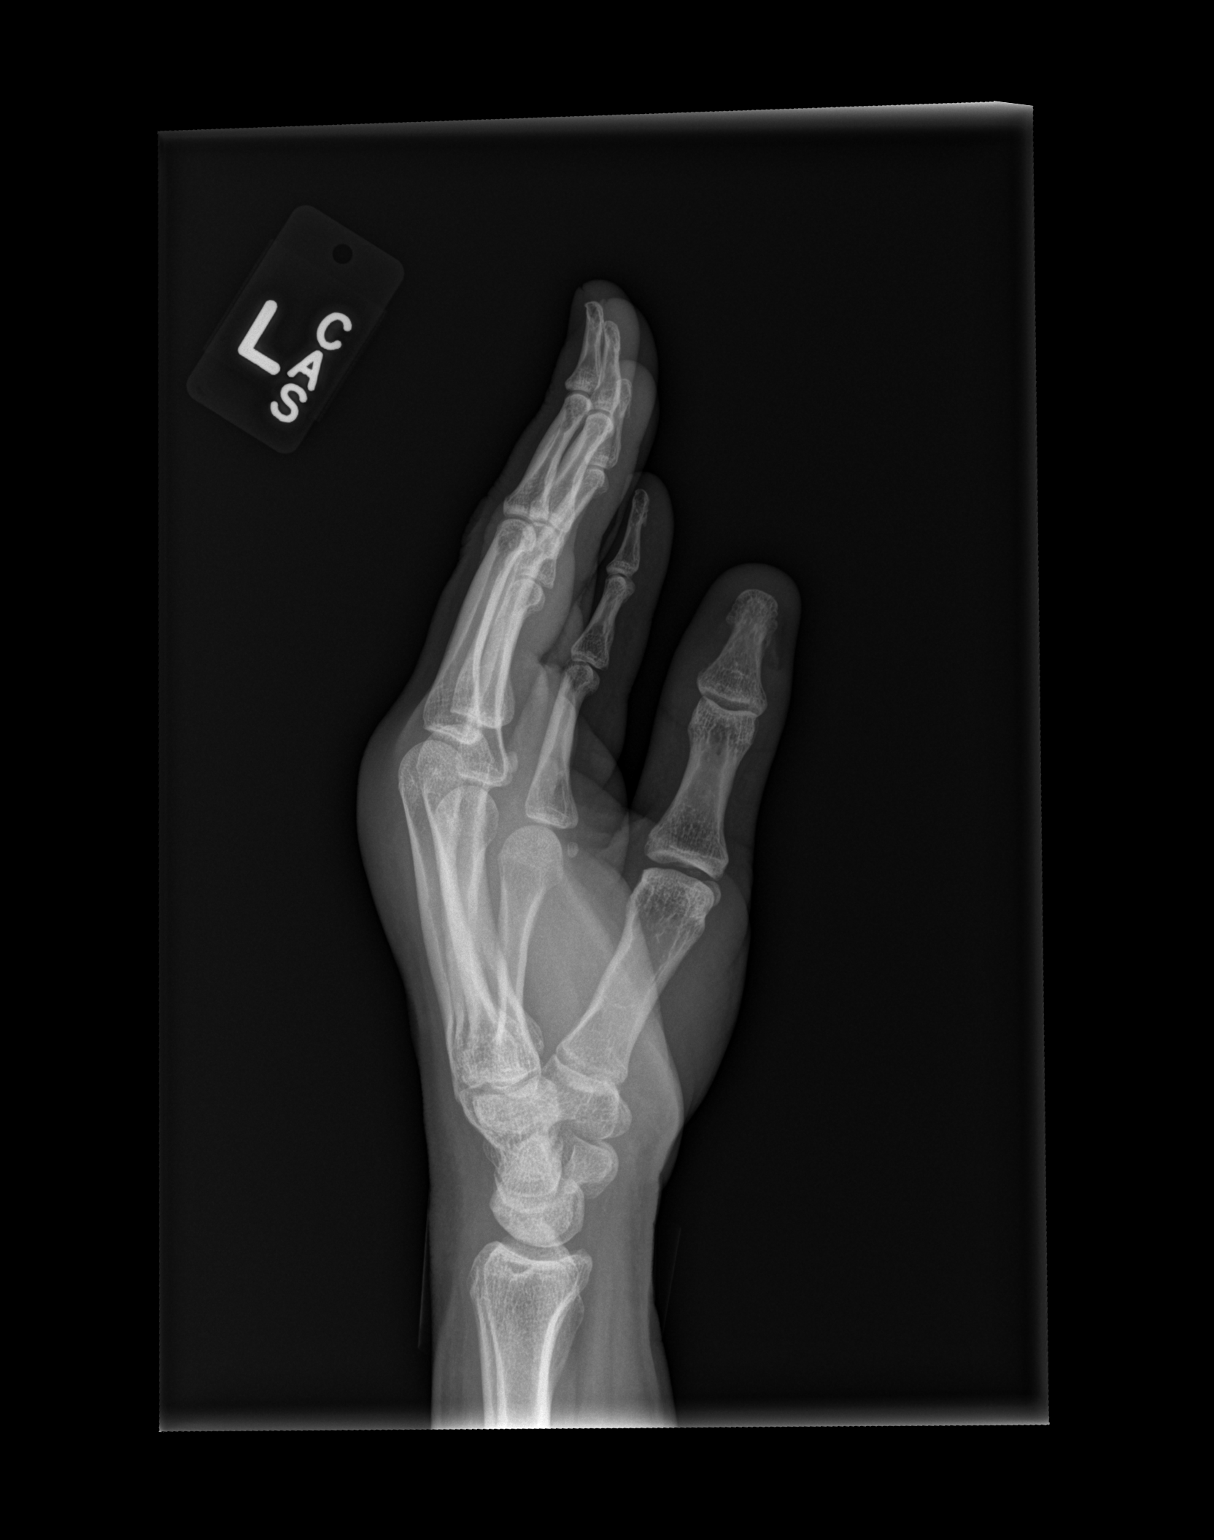

[3 of 3 positions shown; findings below may reference images not displayed]

FINDINGS: Diffuse soft tissue swelling.  No evidence of fracture dislocation.
IMPRESSION: No acute bony abnormality.  Diffuse soft tissue swelling.

## 2014-07-19 IMAGING — CR DG HAND COMPLETE 3+V*R*
3 series · 3 of 3 positions shown · non-contrast
Comparison: None.

CLINICAL DATA: Hit wall.  Bilateral hand pain.

EXAM:
RIGHT HAND - COMPLETE 3+ VIEW

[x hand pa right]
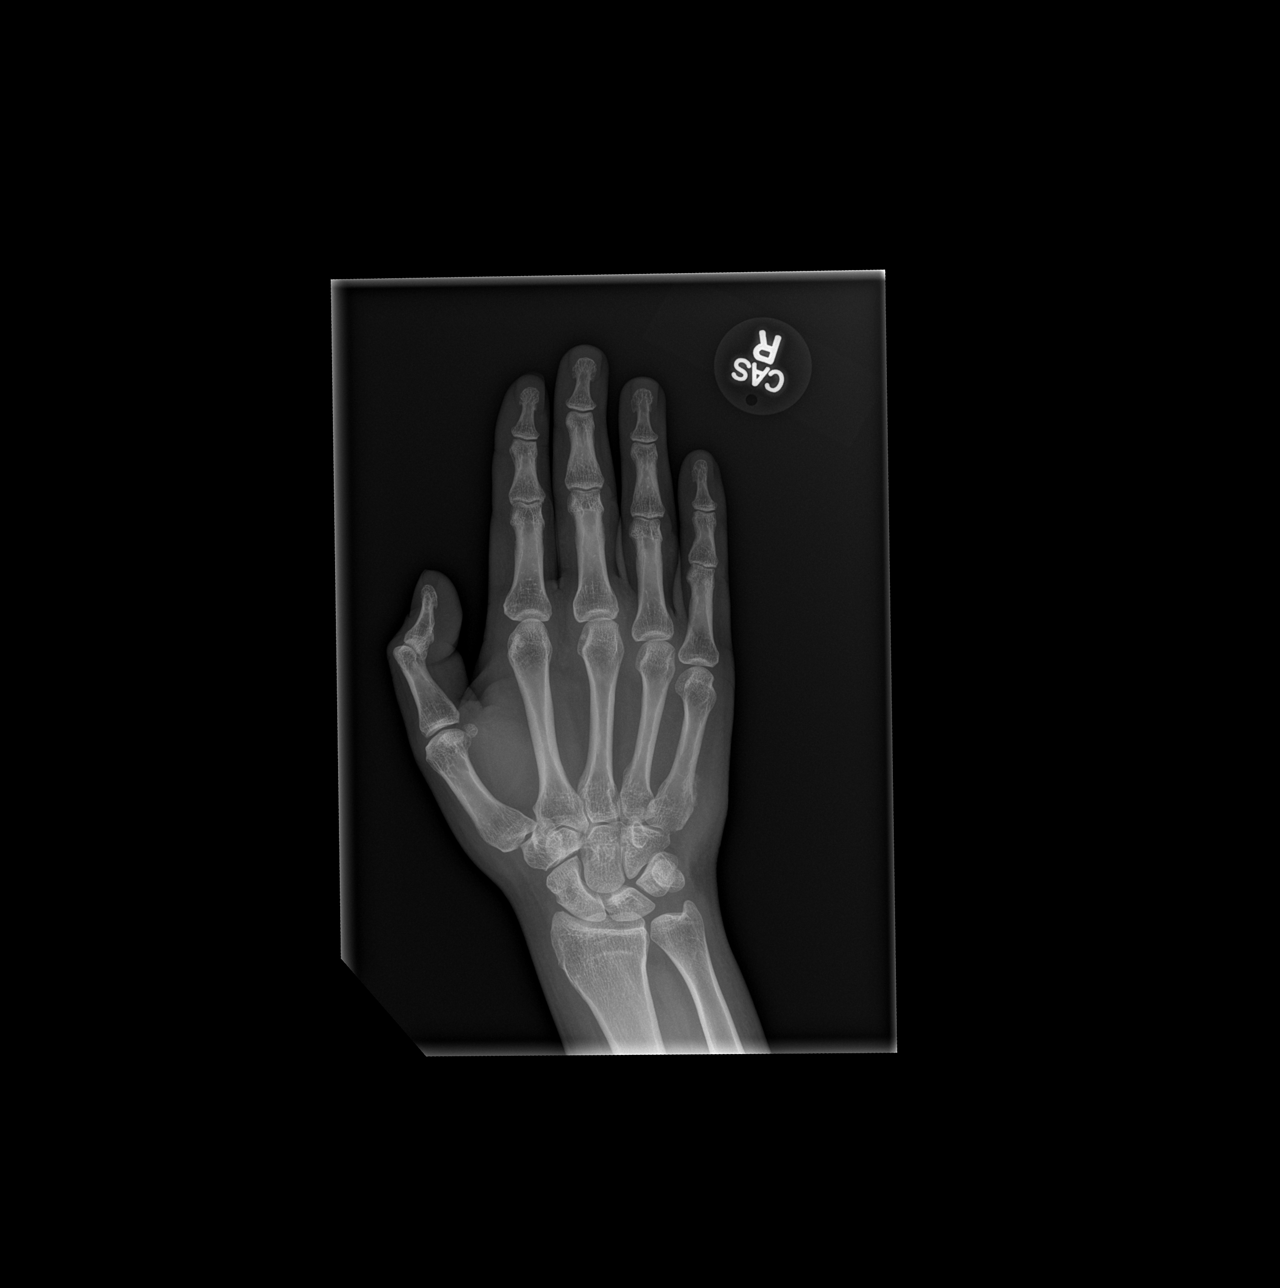

[x hand obl right]
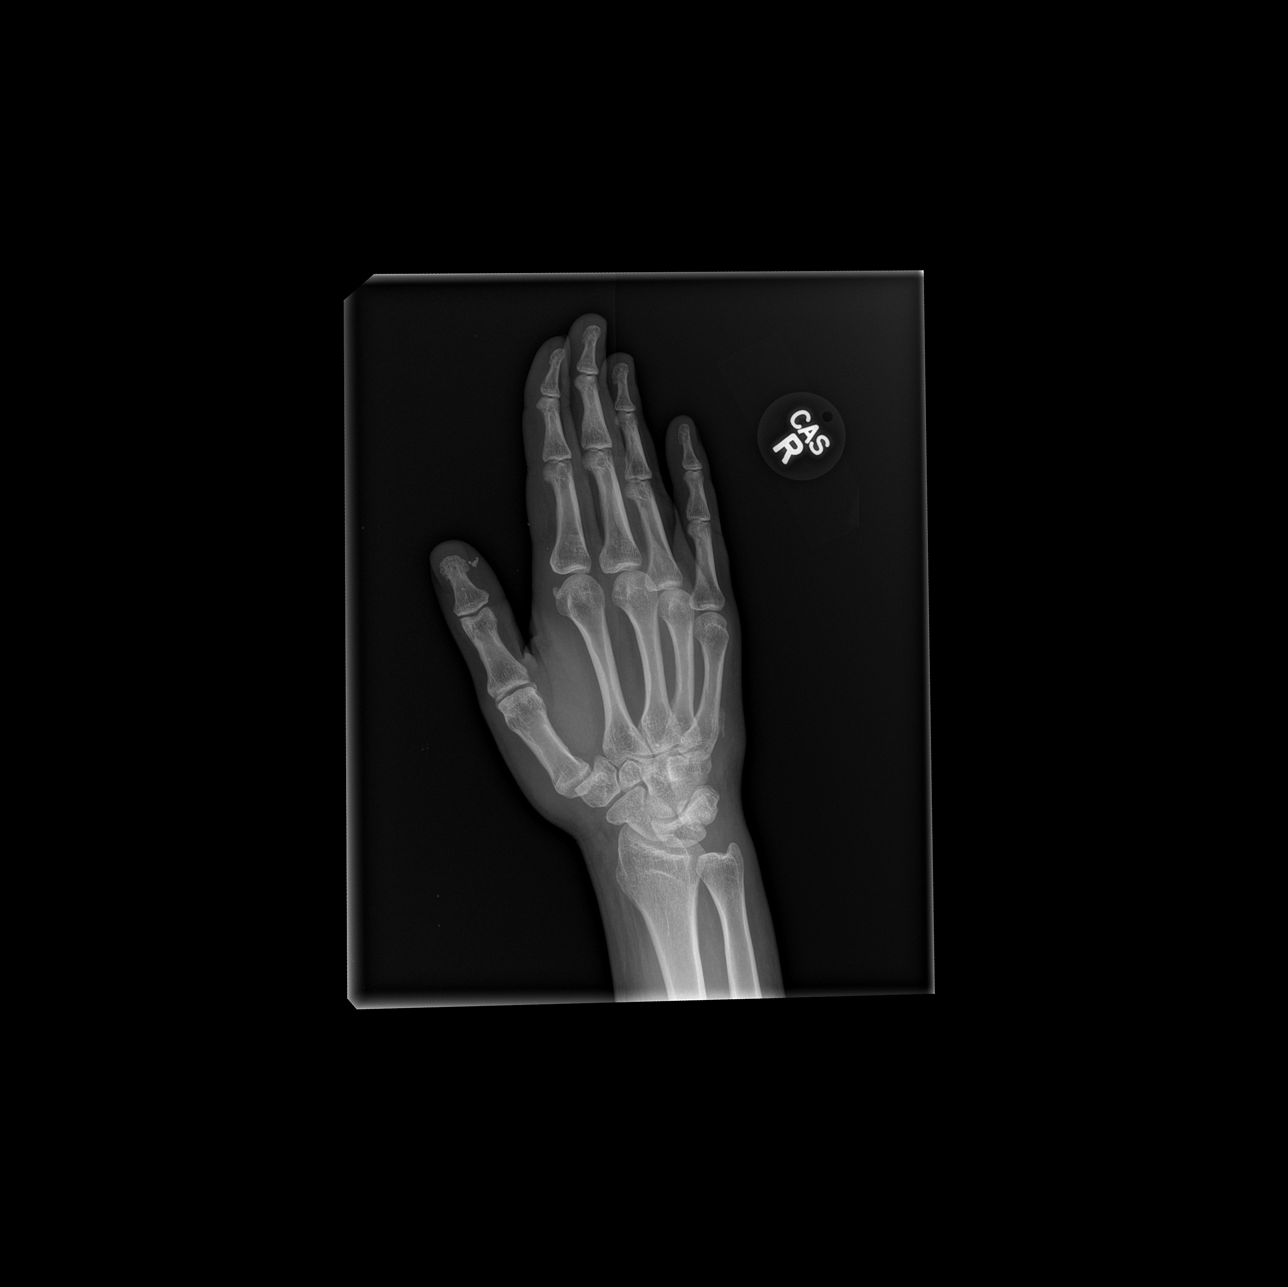

[x hand lat right]
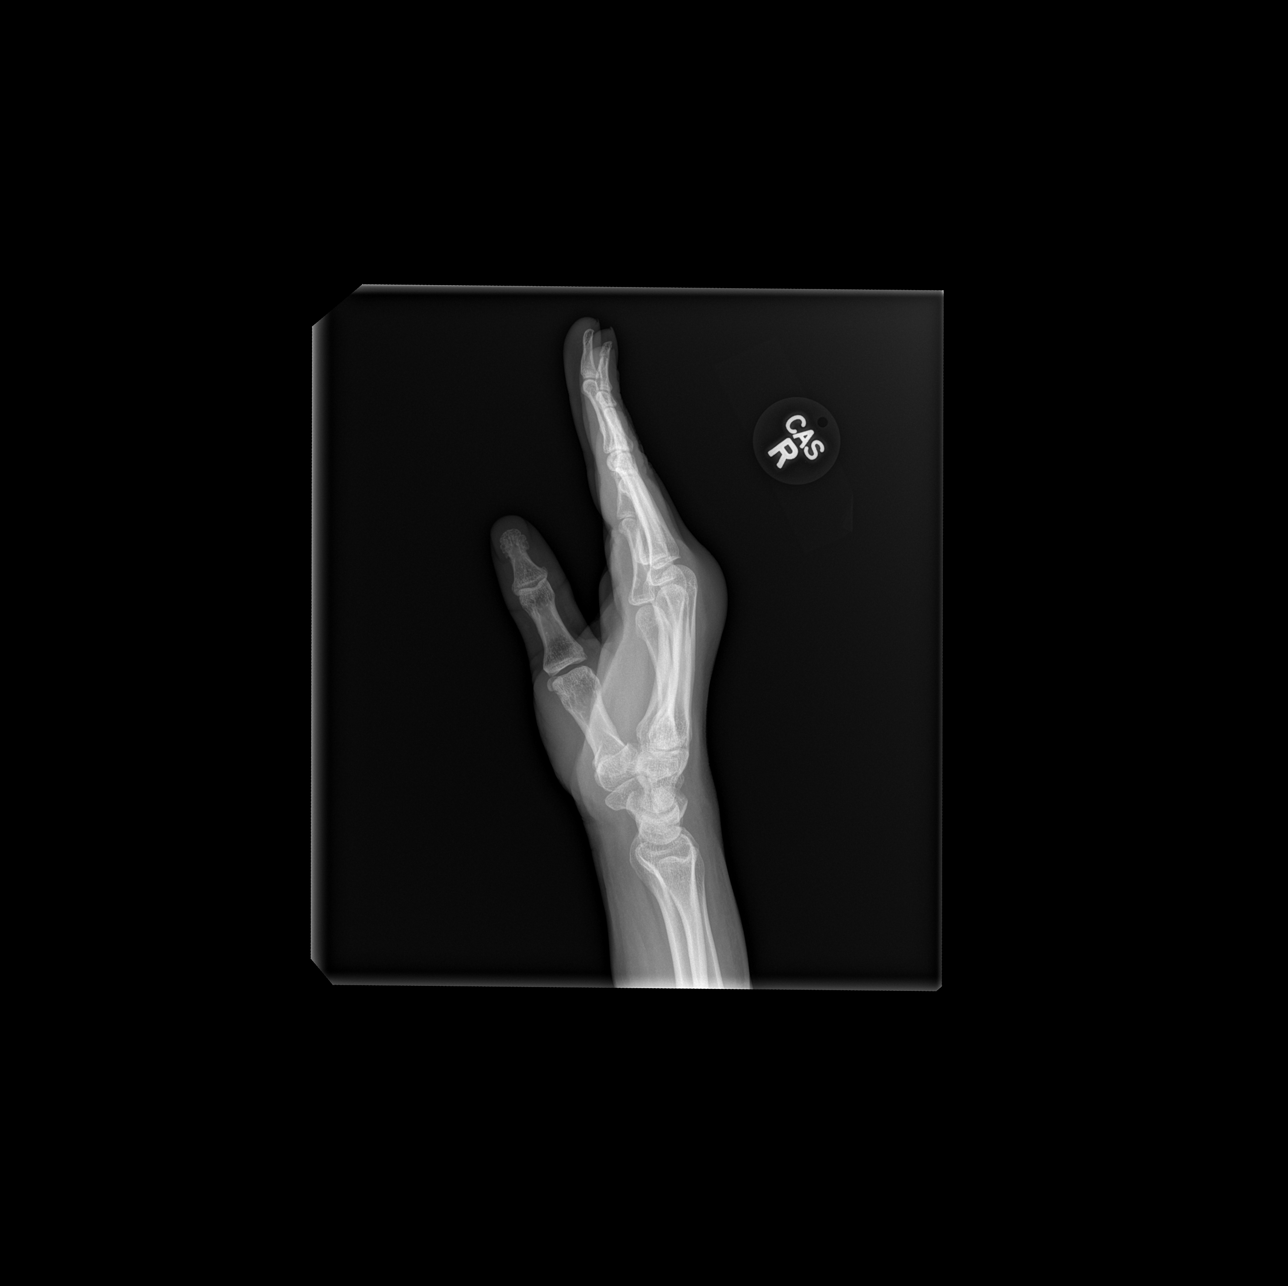

[3 of 3 positions shown; findings below may reference images not displayed]

FINDINGS: Soft tissue swelling across the MCP joints dorsally. Linear lucency
noted through the midshaft of the right fifth metacarpal concerning
for nondisplaced fracture. Calcification noted along the proximal
right fifth metacarpal may reflect changes of old injury. There is
mild overlying soft tissue swelling.
IMPRESSION: Suspect nondisplaced fracture through the midshaft of the right
fifth metacarpal.

## 2023-08-31 DEATH — deceased
